# Patient Record
Sex: Male | Born: 1955 | Race: White | Hispanic: No | Marital: Married | Smoking: Never smoker
Health system: Southern US, Community
[De-identification: ages and names within clinical notes are randomized; demographics above are authoritative.]

## PROBLEM LIST (undated history)

## (undated) DIAGNOSIS — E119 Type 2 diabetes mellitus without complications: Secondary | ICD-10-CM

## (undated) DIAGNOSIS — E785 Hyperlipidemia, unspecified: Secondary | ICD-10-CM

## (undated) HISTORY — PX: CHOLECYSTECTOMY: SHX55

## (undated) HISTORY — PX: BACK SURGERY: SHX140

---

## 2000-02-04 ENCOUNTER — Encounter: Payer: Self-pay | Admitting: Neurosurgery

## 2000-02-04 ENCOUNTER — Ambulatory Visit (HOSPITAL_COMMUNITY): Admission: RE | Admit: 2000-02-04 | Discharge: 2000-02-04 | Payer: Self-pay | Admitting: Neurosurgery

## 2000-02-25 ENCOUNTER — Ambulatory Visit (HOSPITAL_COMMUNITY): Admission: RE | Admit: 2000-02-25 | Discharge: 2000-02-25 | Payer: Self-pay | Admitting: Neurosurgery

## 2000-02-25 ENCOUNTER — Encounter: Payer: Self-pay | Admitting: Neurosurgery

## 2000-03-16 ENCOUNTER — Encounter: Payer: Self-pay | Admitting: Neurosurgery

## 2000-03-16 ENCOUNTER — Ambulatory Visit (HOSPITAL_COMMUNITY): Admission: RE | Admit: 2000-03-16 | Discharge: 2000-03-16 | Payer: Self-pay | Admitting: Neurosurgery

## 2000-03-30 ENCOUNTER — Ambulatory Visit (HOSPITAL_COMMUNITY): Admission: RE | Admit: 2000-03-30 | Discharge: 2000-03-30 | Payer: Self-pay | Admitting: Neurosurgery

## 2000-03-30 ENCOUNTER — Encounter: Payer: Self-pay | Admitting: Neurosurgery

## 2000-04-13 ENCOUNTER — Encounter: Payer: Self-pay | Admitting: Neurosurgery

## 2000-04-13 ENCOUNTER — Ambulatory Visit (HOSPITAL_COMMUNITY): Admission: RE | Admit: 2000-04-13 | Discharge: 2000-04-13 | Payer: Self-pay | Admitting: Neurosurgery

## 2000-06-18 ENCOUNTER — Encounter: Payer: Self-pay | Admitting: Neurosurgery

## 2000-06-18 ENCOUNTER — Ambulatory Visit (HOSPITAL_COMMUNITY): Admission: RE | Admit: 2000-06-18 | Discharge: 2000-06-18 | Payer: Self-pay | Admitting: Neurosurgery

## 2000-07-11 ENCOUNTER — Encounter: Payer: Self-pay | Admitting: Neurosurgery

## 2000-07-13 ENCOUNTER — Inpatient Hospital Stay (HOSPITAL_COMMUNITY): Admission: RE | Admit: 2000-07-13 | Discharge: 2000-07-15 | Payer: Self-pay | Admitting: Neurosurgery

## 2000-07-13 ENCOUNTER — Encounter: Payer: Self-pay | Admitting: Neurosurgery

## 2000-08-21 ENCOUNTER — Encounter: Payer: Self-pay | Admitting: Neurosurgery

## 2000-08-21 ENCOUNTER — Encounter: Admission: RE | Admit: 2000-08-21 | Discharge: 2000-08-21 | Payer: Self-pay | Admitting: Neurosurgery

## 2000-10-01 ENCOUNTER — Encounter (HOSPITAL_COMMUNITY): Admission: RE | Admit: 2000-10-01 | Discharge: 2000-11-03 | Payer: Self-pay | Admitting: Neurosurgery

## 2000-12-14 ENCOUNTER — Encounter: Admission: RE | Admit: 2000-12-14 | Discharge: 2000-12-14 | Payer: Self-pay | Admitting: Neurosurgery

## 2000-12-14 ENCOUNTER — Encounter: Payer: Self-pay | Admitting: Neurosurgery

## 2001-01-29 ENCOUNTER — Encounter: Admission: RE | Admit: 2001-01-29 | Discharge: 2001-01-29 | Payer: Self-pay | Admitting: Neurosurgery

## 2001-01-29 ENCOUNTER — Encounter: Payer: Self-pay | Admitting: Neurosurgery

## 2001-03-26 ENCOUNTER — Inpatient Hospital Stay (HOSPITAL_COMMUNITY): Admission: RE | Admit: 2001-03-26 | Discharge: 2001-03-29 | Payer: Self-pay | Admitting: Neurosurgery

## 2001-03-26 ENCOUNTER — Encounter: Payer: Self-pay | Admitting: Neurosurgery

## 2004-02-10 ENCOUNTER — Ambulatory Visit (HOSPITAL_COMMUNITY): Admission: RE | Admit: 2004-02-10 | Discharge: 2004-02-10 | Payer: Self-pay | Admitting: *Deleted

## 2004-02-17 ENCOUNTER — Ambulatory Visit (HOSPITAL_COMMUNITY): Admission: RE | Admit: 2004-02-17 | Discharge: 2004-02-17 | Payer: Self-pay | Admitting: *Deleted

## 2005-09-11 ENCOUNTER — Ambulatory Visit (HOSPITAL_COMMUNITY): Admission: RE | Admit: 2005-09-11 | Discharge: 2005-09-11 | Payer: Self-pay | Admitting: Neurosurgery

## 2007-12-26 ENCOUNTER — Ambulatory Visit (HOSPITAL_COMMUNITY): Admission: RE | Admit: 2007-12-26 | Discharge: 2007-12-26 | Payer: Self-pay | Admitting: Family Medicine

## 2010-10-14 NOTE — Op Note (Signed)
[place]. [facility]  Patient:    Samuel Fowler, Samuel Fowler                        MRN: [id number] Proc. Date: 07/13/00 Adm. 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  POSTOPERATIVE DIAGNOSES: 1. 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 2. 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  SURGEON:  Payton Doughty, M.D.  ANESTHESIA:  General endotracheal.  PREPARATION:  Sterile Betadine scrub and prep with alcohol wipe.  COMPLICATIONS:  None.  DESCRIPTION OF PROCEDURE:  A 55 year old right-handed white gentleman with severe spondylosis at L4-5 and L5-S1.  He was taken to the operating room and smoothly anesthetized and intubated, placed prone on the operating table. Following shave, prep, and drape in the usual sterile fashion, skin was infiltrated with 1% lidocaine and 1:400,000 epinephrine.  Skin was incised from top of L4 to the mid-S1, and the laminae of L4, L5, and the top of S1 were exposed bilaterally in the subperiosteal plane out over the facet joints. Intraoperative x-ray confirmed correctness of the level.  The pars interarticularis, lamina, and inferior facet of L4 and L5 and the superior facets of L5 and S1 were removed bilaterally using the high-speed drill.  The ligamentum flavum was removed, and the 4, 5, and 1 roots were dissected free as they rounded their respective pedicles.  At 5-1 on the left side, there was spondylolysis at 5 with a large herniated disk compression at base of tongue the 5 and the 1 roots as they traversed this area.  Right side was slightly less affected but still significant change.  4-5 simply had severe spondylitic disease.  Following complete diskectomy at both levels and complete dissection of the nerve roots, Ray Threaded Fusion Cages 14 x 21 mm were placed bilaterally.  Intraoperative x-ray showed good placement of the cages.  The wound was irrigated  and hemostasis assured.  The fascia was reapproximated with 0 Vicryl in interrupted fashion, subcutaneous tissue was reapproximated with 0 Vicryl in interrupted fashion, subcuticular tissue was reapproximated with 3-0 Vicryl in interrupted fashion.  Skin was closed with 3-0 nylon in running locked fashion.  A Betadine and Telfa dressing was applied and made occlusive with OpSite.  The patient then returned to the recovery room in good condition. DD:  07/13/00 TD:  07/13/00 Job: [id number]

## 2010-10-14 NOTE — H&P (Signed)
[place]. [facility]  Patient:    Samuel Fowler, Samuel Fowler Visit Number: [id number] MRN: [id number]          Service Type: SUR Location: 3000 3023 01 Attending Physician:  Emeterio Reeve Dictated by:   Payton Doughty, M.D. 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  HISTORY OF PRESENT ILLNESS:  This is a 55 year old right-handed white gentleman who in February underwent a 4-5, 5-1 fusion.  He has evidence for pseudo arthrosis and is admitted for an augmentation.  PAST SURGICAL HISTORY:  Remarkable for cholecystectomy in 1991.  No other operations besides his back.  ALLERGIES:  PENICILLIN.  PAST MEDICAL HISTORY:  None.  SOCIAL HISTORY:  He smokes a pack and a half of cigarettes a day.  Drinks alcohol on a social basis.  FAMILY HISTORY:  Mom is 39, in poor health with hypertension.  Dad is deceased at 47 years of age from cancer.  REVIEW OF SYSTEMS:  Remarkable for leg pain when he is walking, night sweats, arm weakness, leg pain, back pain, arm pain, joint pain, neck pain.  PHYSICAL EXAMINATION  HEENT:  Within normal limits.  NECK:  Limited range of motion.  CHEST:  Clear.  CARDIAC:  Regular rate and rhythm.  ABDOMEN:  Nontender.  No hepatosplenomegaly.  Somewhat large.  EXTREMITIES:  Without clubbing or cyanosis.  Peripheral pulses are good.  GENITOURINARY:  Deferred.  NEUROLOGIC:  He is awake, alert, oriented.  Cranial nerves are intact.  Motor examination shows 5/5 strength throughout the upper and lower extremities.  No sensory deficit.  He has a mild left pronator drift.  Reflexes are 2 throughout the right upper extremity, 3 in the left upper extremity. Hoffmans is positive on the left with clonus of the fingers.  The lower extremities have three beats of clonus on the left side, none on the right. Clonus intermittent.  LABORATORIES:  Discography has  demonstrated penetration dye in the cages.  CLINICAL IMPRESSION:  Pseudo arthrosis.  He is admitted now for pedicle screw augmentation.  The risks and benefits of this approach have been discussed with him and he wishes to proceed. Dictated by:   Payton Doughty, M.D. Attending Physician:  Emeterio Reeve DD:  03/26/01 TD:  03/26/01 Job: [id number]

## 2010-10-14 NOTE — Op Note (Signed)
[place]. [facility]  Patient:    Samuel Fowler, Samuel Fowler Visit Number: [id number] MRN: [id number]          Service Type: SUR Location: 3000 3023 01 Attending Physician:  Emeterio Reeve Dictated by:   Payton Doughty, M.D. Proc. Date: 03/26/01 Admit Date:  03/26/2001                             Operative Report  PREOPERATIVE DIAGNOSIS:  Pseudoarthrosis L4-5, L5-S1.  POSTOPERATIVE DIAGNOSIS:  Pseudoarthrosis L4-5, L5-S1.  OPERATION PERFORMED:  L4-5, L5-S1 pedicle screw augmentation and fusion with lateral arthrodesis.  SURGEON:  Payton Doughty, M.D.  ASSISTANT:  Cristi Loron, M.D.  ANESTHESIA:  General endotracheal.  PREP:  Sterile Betadine prep and scrub with alcohol wipe.  COMPLICATIONS:  None.  DESCRIPTION OF PROCEDURE:  The patient is a 55 year old right-handed white male with pseudoarthrosis at 4-5 and 5-1.  The patient was taken to the operating room, smoothly anesthetized and intubated, and placed prone on the operating table.  Following shave, prep and drape in the usual sterile fashion the skin was infiltrated with 1% lidocaine, 1:400,000 epinephrine.  The old skin incision was reopened and extended by 2 cm on each end.  The lamina of L3 was identified and this was followed out to the 3-4 facet joint and the transverse process of 4.  Working lateral to this, the transverse process of 5 and the sacral ala were then identified and followed medially to their respective pedicles of L5 and S1.  This was carried out bilaterally exposing the transverse process, sacral ala and top of the pedicles of L4, L5 and S1. Using these landmarks, pedicle screws were placed without difficulty and intraoperative x-ray showed good placement of screws.  Prior to placing the screw in the sacrum, bone marrow was aspirated and placed in the ____________ matrix.  This was then used for the intertransverse arthrodesis.  Following placement of the arthrodesis materials,  rods were placed in the screws and capped.  The intraoperative x-ray showed good placement of pedicle screws and rods.  The fascia was reapproximated with 0 Vicryl in interrupted fashion. Subcutaneous tissues were reapproximated with 0 Vicryl in interrupted fashion. subcuticular tissues reapproximated with 3-0 Vicryl in interrupted fashion. The skin was closed with 3-0 nylon in running locked fashion.  Betadine and Telfa dressing was applied and made occlusive with Op-Site.  The patient then returned to the recovery room in good condition. Dictated by:   Payton Doughty, M.D. Attending Physician:  Emeterio Reeve DD:  03/26/01 TD:  03/27/01 Job: [id number]

## 2010-10-14 NOTE — H&P (Signed)
[place]. [facility]  Patient:    Samuel Fowler, Samuel Fowler                        MRN: [id number] Adm. Date:  [id number] Disc. 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  SERVICE:  Neurosurgery.  HISTORY OF PRESENT ILLNESS:  A very nice 55 year old left-handed white gentleman who I saw initially in August for neck and shoulder pain.  His MRI at that time was unremarkable in his neck, but he had some clonus in his lower extremities and was studied with MR of his brain which was normal.  His major complaint has always been back pain and pain in his legs.  He was studied with MRI of his lumbar spine which demonstrates spondylitic disease and spondylolysis of L5 without L5-S1 slip.  He tried epidural steroids to no avail.  Discography was strongly and concordantly positive of both 4-5 and 5-1 and he is now admitted for a fusion.  SURGICAL HISTORY:  Remarkable for a cholecystectomy in 1991.  He has had no other operations.  ALLERGIES:  PENICILLIN.  MEDICAL HISTORY:  He has no other medical problems.  SOCIAL HISTORY:  He smokes a pack-and-a-half of cigarettes a day and drinks alcohol on a social basis.  FAMILY HISTORY:  Mom is 38 and in poor health with hypertension, arteriosclerosis.  Dad is deceased at 85 years of age of lung cancer.  REVIEW OF SYSTEMS:  Remarkable for leg pain while he is walking, night sweats, arm weakness, leg weakness, back pain, arm pain, leg pain, joint pain, and neck pain.  PHYSICAL EXAMINATION:  HEENT:  Within normal limits.  NECK:  He has limited range of motion in his neck.  CHEST:  Diffuse crackles.  CARDIAC:  Regular rate and rhythm.  ABDOMEN:  Large but nontender.  There is no hepatosplenomegaly.  EXTREMITIES:  Without clubbing or cyanosis.  GENITOURINARY:  Deferred.  PERIPHERAL PULSES:   Good.  NEUROLOGIC:  He is awake, alert, and oriented.  His cranial nerves are intact. Motor exam is 5/5 strength throughout the upper and lower extremities.  There is no sensory deficit.  He does have a mild left pronator drift.  Reflexes are 2 throughout in the right upper extremity, 3 in the left upper extremity. Hoffmanns is positive on the left with clonus of the fingers.  In the lower extremities, he has three beats of clonus on the left side, none on the right. The clonus is intermittent.  LABORATORY DATA:  MRI and discography results have been reviewed above.  CLINICAL IMPRESSION:  Spondylolysis of L5, spondylosis at 4-5 and 5-1.  PLAN:  The plan is for a lumbar laminectomy and diskectomy with posterior lumbar interbody fusion.   The risks and benefits of this approach have been discussed with him and he wishes to proceed. DD:  07/13/00 TD:  07/13/00 Job: [id number]

## 2010-10-14 NOTE — Procedures (Signed)
NAME:  ANDI, MAHAFFY               ACCOUNT NO.:  [id number]   MEDICAL RECORD NO.:  [id number]          PATIENT TYPE:  OUT   LOCATION:  RAD                           FACILITY:  [facility]   PHYSICIAN:  Vida Roller, M.D.   DATE OF BIRTH:  08/02/1955   DATE OF PROCEDURE:  02/17/2004  DATE OF DISCHARGE:                                    STRESS TEST   HISTORY:  Mr. Salceda is a 55 year old gentleman with no known coronary  disease with atypical chest discomfort whose cardiac risk factors include  tobacco abuse, family history, and unknown lipid status.   BASELINE DATA:  EKG reveals sinus rhythm at 78 beats per minute, nonspecific  ST abnormalities, blood pressure is 120/62.   Adenosine 65 mg was infused over a 4 minute protocol with Cardiolite  injected at 3 minutes.  The patient reported chest tightness, shortness of  breath, flushing, weakness, and nausea which resolved in recovery.  He  reports that this feels just like the episodes he had described to Dr.  Dorethea Clan on February 10, 2004.  EKG revealed no arrhythmias and no ischemic  changes.   Final images and results are pending M.D. review.      AB/MEDQ  D:  02/17/2004  T:  02/18/2004  Job:  [id number]

## 2010-10-14 NOTE — Discharge Summary (Signed)
[place]. [facility]  Patient:    Samuel Fowler, Samuel Fowler Visit Number: [id number] MRN: [id number]          Service Type: SUR Location: 3000 3023 01 Attending Physician:  Emeterio Reeve Dictated by:   Payton Doughty, M.D. 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  PROCEDURE:  L4-5, L5-S1 augmentation with pedicle screws.  COMPLICATIONS:  None.  DISCHARGE STATUS:  Alive and well.  HISTORY OF PRESENT ILLNESS:  This is a 54 year old right-handed white gentleman whose history and physical is recounted in the chart.  He had a Ray cage fusion in February, had done well for a while, had increasing back pain after physical therapy.  Discography demonstrated nonunion and he is admitted for pedicle screw augmentation.  MEDICAL HISTORY:  Relatively benign.  SOCIAL HISTORY:  He smokes a pack and a half of cigarettes a day.  ALLERGIES:  He is allergic to PENICILLIN.  MEDICATIONS:  He is not on any other medications.  PHYSICAL EXAMINATION:  General exam within normal limits.  Neurologic exam was intact with low back pain with any kind of movement and pain down into his right hip.  HOSPITAL COURSE:  He was admitted after ascertainment of normal laboratory values and underwent a 4-5, 5-1 pedicle screw augmentation.  Postoperatively, he has done very well.  His leg pain is completely gone.  He has incisional back pain but has been up and about and walking in physical therapy.  Foley was stopped the first day and PCA stopped the second day.  He is now up and about, alert and oriented, satisfied with his activities of daily living.  DISCHARGE MEDICATIONS:  He is being discharged home on Percocet for pain and ciprofloxacin for skin prophylaxis for a small amount of drainage he had from his back for a day or  so.  FOLLOWUP:  His followup will be in [facility] office next Friday for suture removal. Dictated by:   Payton Doughty, M.D. Attending Physician:  Emeterio Reeve DD:  03/29/01 TD:  03/30/01 Job: [id number]

## 2017-08-13 DIAGNOSIS — M47816 Spondylosis without myelopathy or radiculopathy, lumbar region: Secondary | ICD-10-CM | POA: Diagnosis not present

## 2017-11-20 DIAGNOSIS — M47816 Spondylosis without myelopathy or radiculopathy, lumbar region: Secondary | ICD-10-CM | POA: Diagnosis not present

## 2018-02-19 DIAGNOSIS — M47816 Spondylosis without myelopathy or radiculopathy, lumbar region: Secondary | ICD-10-CM | POA: Diagnosis not present

## 2018-06-19 DIAGNOSIS — M47816 Spondylosis without myelopathy or radiculopathy, lumbar region: Secondary | ICD-10-CM | POA: Diagnosis not present

## 2018-08-21 DIAGNOSIS — F419 Anxiety disorder, unspecified: Secondary | ICD-10-CM | POA: Diagnosis not present

## 2018-08-21 DIAGNOSIS — Z6828 Body mass index (BMI) 28.0-28.9, adult: Secondary | ICD-10-CM | POA: Diagnosis not present

## 2018-09-24 DIAGNOSIS — D044 Carcinoma in situ of skin of scalp and neck: Secondary | ICD-10-CM | POA: Diagnosis not present

## 2018-09-24 DIAGNOSIS — C4442 Squamous cell carcinoma of skin of scalp and neck: Secondary | ICD-10-CM | POA: Diagnosis not present

## 2018-10-11 DIAGNOSIS — Z6827 Body mass index (BMI) 27.0-27.9, adult: Secondary | ICD-10-CM | POA: Diagnosis not present

## 2018-10-11 DIAGNOSIS — C4442 Squamous cell carcinoma of skin of scalp and neck: Secondary | ICD-10-CM | POA: Diagnosis not present

## 2018-10-29 DIAGNOSIS — Z6826 Body mass index (BMI) 26.0-26.9, adult: Secondary | ICD-10-CM | POA: Diagnosis not present

## 2018-10-29 DIAGNOSIS — M5430 Sciatica, unspecified side: Secondary | ICD-10-CM | POA: Diagnosis not present

## 2019-01-18 ENCOUNTER — Emergency Department (HOSPITAL_COMMUNITY): Payer: Medicare PPO

## 2019-01-18 ENCOUNTER — Other Ambulatory Visit: Payer: Self-pay

## 2019-01-18 ENCOUNTER — Emergency Department (HOSPITAL_COMMUNITY)
Admission: EM | Admit: 2019-01-18 | Discharge: 2019-01-19 | Discharge status: Against Medical Advice | Payer: Medicare PPO | Attending: Emergency Medicine | Admitting: Emergency Medicine

## 2019-01-18 ENCOUNTER — Encounter (HOSPITAL_COMMUNITY): Payer: Self-pay | Admitting: Emergency Medicine

## 2019-01-18 DIAGNOSIS — M79645 Pain in left finger(s): Secondary | ICD-10-CM | POA: Diagnosis present

## 2019-01-18 DIAGNOSIS — M65842 Other synovitis and tenosynovitis, left hand: Secondary | ICD-10-CM | POA: Diagnosis not present

## 2019-01-18 DIAGNOSIS — Z23 Encounter for immunization: Secondary | ICD-10-CM | POA: Insufficient documentation

## 2019-01-18 DIAGNOSIS — L089 Local infection of the skin and subcutaneous tissue, unspecified: Secondary | ICD-10-CM | POA: Diagnosis not present

## 2019-01-18 DIAGNOSIS — R600 Localized edema: Secondary | ICD-10-CM | POA: Diagnosis not present

## 2019-01-18 DIAGNOSIS — M65142 Other infective (teno)synovitis, left hand: Secondary | ICD-10-CM | POA: Diagnosis not present

## 2019-01-18 DIAGNOSIS — M659 Synovitis and tenosynovitis, unspecified: Secondary | ICD-10-CM

## 2019-01-18 NOTE — ED Triage Notes (Signed)
Hand injury on Weds  Hit blade or deck of lawnmower  Swelling and pain since last night   Redness and swelling to R hand especially the R little finger

## 2019-01-19 LAB — COMPREHENSIVE METABOLIC PANEL
ALT: 25 U/L (ref 0–44)
AST: 18 U/L (ref 15–41)
Albumin: 4.2 g/dL (ref 3.5–5.0)
Alkaline Phosphatase: 68 U/L (ref 38–126)
Anion gap: 8 (ref 5–15)
BUN: 13 mg/dL (ref 8–23)
CO2: 26 mmol/L (ref 22–32)
Calcium: 9.1 mg/dL (ref 8.9–10.3)
Chloride: 102 mmol/L (ref 98–111)
Creatinine, Ser: 0.72 mg/dL (ref 0.61–1.24)
GFR calc Af Amer: >60 mL/min (ref >60)
GFR calc non Af Amer: >60 mL/min (ref >60)
Glucose, Bld: 109 mg/dL — ABNORMAL HIGH (ref 70–99)
Potassium: 3.6 mmol/L (ref 3.5–5.1)
Sodium: 136 mmol/L (ref 135–145)
Total Bilirubin: 1 mg/dL (ref 0.3–1.2)
Total Protein: 7.7 g/dL (ref 6.5–8.1)

## 2019-01-19 LAB — CBC WITH DIFFERENTIAL/PLATELET
Abs Immature Granulocytes: 0.04 10*3/uL (ref 0.00–0.07)
Basophils Absolute: 0 10*3/uL (ref 0.0–0.1)
Basophils Relative: 0 %
Eosinophils Absolute: 0.2 10*3/uL (ref 0.0–0.5)
Eosinophils Relative: 1 %
HCT: 40.1 % (ref 39.0–52.0)
Hemoglobin: 13.2 g/dL (ref 13.0–17.0)
Immature Granulocytes: 0 %
Lymphocytes Relative: 19 %
Lymphs Abs: 2.6 10*3/uL (ref 0.7–4.0)
MCH: 30.3 pg (ref 26.0–34.0)
MCHC: 32.9 g/dL (ref 30.0–36.0)
MCV: 92 fL (ref 80.0–100.0)
Monocytes Absolute: 1 10*3/uL (ref 0.1–1.0)
Monocytes Relative: 7 %
Neutro Abs: 10.1 10*3/uL — ABNORMAL HIGH (ref 1.7–7.7)
Neutrophils Relative %: 73 %
Platelets: 221 10*3/uL (ref 150–400)
RBC: 4.36 MIL/uL (ref 4.22–5.81)
RDW: 13.5 % (ref 11.5–15.5)
WBC: 13.9 10*3/uL — ABNORMAL HIGH (ref 4.0–10.5)
nRBC: 0 % (ref 0.0–0.2)

## 2019-01-19 LAB — LACTIC ACID, PLASMA: Lactic Acid, Venous: 0.7 mmol/L (ref 0.5–1.9)

## 2019-01-19 MED ORDER — VANCOMYCIN HCL IN DEXTROSE 1-5 GM/200ML-% IV SOLN: 1000.0000 mg | Freq: Three times a day (TID) | INTRAVENOUS | Status: DC

## 2019-01-19 MED ORDER — SODIUM CHLORIDE 0.9 % IV SOLN
2.0000 g | Freq: Once | INTRAVENOUS | Status: AC
  Administered 2019-01-19: 2 g via INTRAVENOUS
  Filled 2019-01-19: qty 2

## 2019-01-19 MED ORDER — TETANUS-DIPHTH-ACELL PERTUSSIS 5-2.5-18.5 LF-MCG/0.5 IM SUSP
0.5000 mL | Freq: Once | INTRAMUSCULAR | Status: AC
  Administered 2019-01-19: 0.5 mL via INTRAMUSCULAR
  Filled 2019-01-19: qty 0.5

## 2019-01-19 MED ORDER — DOXYCYCLINE HYCLATE 100 MG PO CAPS: 100.0000 mg | ORAL_CAPSULE | Freq: Two times a day (BID) | ORAL | 0 refills | Status: DC

## 2019-01-19 MED ORDER — VANCOMYCIN HCL IN DEXTROSE 1-5 GM/200ML-% IV SOLN
1000.0000 mg | INTRAVENOUS | Status: AC
  Administered 2019-01-19 (×2): 1000 mg via INTRAVENOUS
  Filled 2019-01-19 (×2): qty 200

## 2019-01-19 NOTE — Discharge Instructions (Addendum)
You are leaving [place].  It was recommended that you be transferred to [facility] for evaluation by the hand surgeon for probable surgery.  It is unlikely this infection will get better with just antibiotics.  As we discussed, the infection can get worse which can progress to the loss of your finger or a serious infection that can be life-threatening. Return to the ED if you wish to be reevaluated.

## 2019-01-19 NOTE — ED Notes (Signed)
Pt states he hit his hand on the lawnmower this past Wednesday on the outside of his left little finger, but the swelling worsened and the skin broke open on the opposite side of same finger and has drainage. Hand is remarkably swollen, red, warm to touch and ender. Pt cannot remember last Tetanus shot.

## 2019-01-19 NOTE — Progress Notes (Signed)
Pharmacy Antibiotic Note  Samuel Fowler is a 63 y.o. male admitted on 01/18/2019 with cellulitis.  Pharmacy has been consulted for vancomycin dosing.  Plan: Vancomycin 2000 mg IV loading dose, followed by vancomycin 1000 mg IV every 8 hours. Monitor clinical progress, cultures/sensitivities, renal function, abx plan, Vancomycin levels as indicated.   Height: 5\' 8"  (172.7 cm) Weight: 182 lb (82.6 kg) IBW/kg (Calculated) : 68.4  Temp (24hrs), Avg:97.8 F (36.6 C), Min:97.8 F (36.6 C), Max:97.8 F (36.6 C)  Recent Labs  Lab 01/19/19 0115  WBC 13.9*  CREATININE 0.72  LATICACIDVEN 0.7    Estimated Creatinine Clearance: 100.3 mL/min (by C-G formula based on SCr of 0.72 mg/dL).    Allergies  Allergen Reactions  . Aspirin   . Penicillins     Antimicrobials this admission: 8/23 cefepime >>  8/23 vancomycin >>   Dose adjustments this admission:  Microbiology results: 8/23 BCx: pending  Thank you for allowing pharmacy to be a part of this patient's care.  Jens Som, PharmD 01/19/2019 2:16 AM

## 2019-01-19 NOTE — ED Provider Notes (Signed)
[facility] EMERGENCY DEPARTMENT Provider Note   CSN: [id number] Arrival date & time: 01/18/19  2047     History   Chief Complaint Chief Complaint  Patient presents with  . Hand Problem    HPI Samuel Fowler is a 63 y.o. male.     Patient states he struck his left small finger against the blade of a lawnmower 3 days ago as he was trying to adjust it.  The lawnmower was not running.  He sustained a small laceration to the distal phalanx of his left fifth digit.  Over the past 1 day he had increased pain, redness, swelling and drainage from his fifth finger.  There is a wound that has opened up on the radial side of the finger even though injury occurred on the ulnar side.  He states this finger has become increasingly warm, swollen, painful, unable to move it.  Denies fevers.  Denies any nausea or vomiting.  Has some numbness and tingling in the finger.  Does not know when his last tetanus shot was.  Allergic to penicillin.  The history is provided by the patient.    History reviewed. No pertinent past medical history.  There are no active problems to display for this patient.   History reviewed. No pertinent surgical history.      Home Medications    Prior to Admission medications   Not on File    Family History History reviewed. No pertinent family history.  Social History Social History   Tobacco Use  . Smoking status: Never Smoker  . Smokeless tobacco: Never Used  Substance Use Topics  . Alcohol use: Not Currently  . Drug use: Yes    Types: Marijuana     Allergies   Aspirin and Penicillins   Review of Systems Review of Systems  Constitutional: Negative for activity change, appetite change and fever.  HENT: Negative for congestion and rhinorrhea.   Eyes: Negative for visual disturbance.  Respiratory: Negative for cough, chest tightness and shortness of breath.   Cardiovascular: Negative for chest pain.  Gastrointestinal: Negative for abdominal  pain, nausea and vomiting.  Genitourinary: Negative for dysuria and hematuria.  Musculoskeletal: Positive for arthralgias and myalgias.  Skin: Positive for wound.  Neurological: Negative for dizziness, weakness and headaches.   all other systems are negative except as noted in the HPI and PMH.     Physical Exam Updated Vital Signs BP (!) 144/88 (BP Location: Right Arm)   Pulse (!) 104   Temp 97.8 F (36.6 C) (Oral)   Resp 16   Ht 5\' 8"  (1.727 m)   Wt 82.6 kg   SpO2 100%   BMI 27.67 kg/m   Physical Exam Vitals signs and nursing note reviewed.  Constitutional:      General: He is not in acute distress.    Appearance: He is well-developed.  HENT:     Head: Normocephalic and atraumatic.     Mouth/Throat:     Pharynx: No oropharyngeal exudate.  Eyes:     Conjunctiva/sclera: Conjunctivae normal.     Pupils: Pupils are equal, round, and reactive to light.  Neck:     Musculoskeletal: Normal range of motion and neck supple.     Comments: No meningismus. Cardiovascular:     Rate and Rhythm: Normal rate and regular rhythm.     Heart sounds: Normal heart sounds. No murmur.  Pulmonary:     Effort: Pulmonary effort is normal. No respiratory distress.     Breath sounds:  Normal breath sounds.  Abdominal:     Palpations: Abdomen is soft.     Tenderness: There is no abdominal tenderness. There is no guarding or rebound.  Musculoskeletal: Normal range of motion.        General: Swelling, tenderness and signs of injury present.     Comments: Left fifth digit is as depicted.  This finger is warm, swollen, red, held in flexed position with minimal movement.  There is diffuse erythema of the proximal phalanx.  There is a small superficial wound to the ulnar distal phalanx.  There is a second break in the skin draining purulent material on the radial side.  Reduced range of motion of PIP and DIP and MCP joints. Tenderness palpation along flexor tendon sheath.  Skin:    General: Skin is  warm.     Capillary Refill: Capillary refill takes less than 2 seconds.  Neurological:     General: No focal deficit present.     Mental Status: He is alert and oriented to person, place, and time. Mental status is at baseline.     Cranial Nerves: No cranial nerve deficit.     Motor: No abnormal muscle tone.     Coordination: Coordination normal.     Comments: No ataxia on finger to nose bilaterally. No pronator drift. 5/5 strength throughout. CN 2-12 intact.Equal grip strength. Sensation intact.   Psychiatric:        Behavior: Behavior normal.            ED Treatments / Results  Labs (all labs ordered are listed, but only abnormal results are displayed) Labs Reviewed  CBC WITH DIFFERENTIAL/PLATELET - Abnormal; Notable for the following components:      Result Value   WBC 13.9 (*)    Neutro Abs 10.1 (*)    All other components within normal limits  COMPREHENSIVE METABOLIC PANEL - Abnormal; Notable for the following components:   Glucose, Bld 109 (*)    All other components within normal limits  CULTURE, BLOOD (ROUTINE X 2)  CULTURE, BLOOD (ROUTINE X 2)  LACTIC ACID, PLASMA    EKG None  Radiology Dg Hand Complete Left  Result Date: 01/18/2019 CLINICAL DATA:  Scratched small finger on lawnmower blade 1 week ago, severe swelling and skin tearing EXAM: LEFT HAND - COMPLETE 3+ VIEW COMPARISON:  None. FINDINGS: No fracture or dislocation of the left hand. Joint spaces are well preserved. There is severe soft tissue edema of the fifth finger. No radiopaque foreign body. IMPRESSION: No fracture or dislocation of the left hand. Joint spaces are well preserved. There is severe soft tissue edema of the fifth finger. No radiopaque foreign body. Electronically Signed   By: Eddie Candle M.D.   On: 01/18/2019 21:31    Procedures Procedures (including critical care time)  Medications Ordered in ED Medications  ceFEPIme (MAXIPIME) 2 g in sodium chloride 0.9 % 100 mL IVPB (has no  administration in time range)  Tdap (BOOSTRIX) injection 0.5 mL (0.5 mLs Intramuscular Given 01/19/19 0034)     Initial Impression / Assessment and Plan / ED Course  I have reviewed the triage vital signs and the nursing notes.  Pertinent labs & imaging results that were available during my care of the patient were reviewed by me and considered in my medical decision making (see chart for details).       Cellulitis of finger with concern for deep space infection and possibly flexor tenosynovitis.  No fever.  X-rays negative.  Will update tetanus and give IV antibiotics. Patient is PCN allergic.   Discussed with hand surgery.  Discussed with hand surgery Dr. Merlyn LotKuzma who reviewed patient's pictures and images on the computer.  He recommends transfer [facility] tonight for assessment of possible debridement. Wounds are dorsal but patient holding finger in flexed position and does not want to extend finger there is some concern for flexor tendon involvement as well.  Patient adamantly refuses to go to [facility].  States he is "fine" and does not like hospitals. He just wants antibiotics and wants to go home.  Discussed with patient that he has a serious infection that he could potentially die from if he gets sepsis.  Discussed he could potentially lose his finger.  Patient states he understands this but refuses to go to [facility] and wishes to have antibiotics and go home.  Discussed with Dr. Merlyn LotKuzma.  Patient does appear to have capacity to refuse care and leave AGAINST MEDICAL ADVICE  Final Clinical Impressions(s) / ED Diagnoses   Final diagnoses:  Finger infection  Tenosynovitis    ED Discharge Orders    None       Quincie Haroon, Jeannett SeniorStephen, MD 01/19/19 [phone]

## 2019-01-24 LAB — CULTURE, BLOOD (ROUTINE X 2)
Culture: NO GROWTH
Culture: NO GROWTH
Special Requests: ADEQUATE

## 2019-01-29 DIAGNOSIS — F419 Anxiety disorder, unspecified: Secondary | ICD-10-CM | POA: Diagnosis not present

## 2019-01-29 DIAGNOSIS — Z6827 Body mass index (BMI) 27.0-27.9, adult: Secondary | ICD-10-CM | POA: Diagnosis not present

## 2019-01-29 DIAGNOSIS — L0390 Cellulitis, unspecified: Secondary | ICD-10-CM | POA: Diagnosis not present

## 2019-02-05 DIAGNOSIS — F419 Anxiety disorder, unspecified: Secondary | ICD-10-CM | POA: Diagnosis not present

## 2019-02-05 DIAGNOSIS — Z6825 Body mass index (BMI) 25.0-25.9, adult: Secondary | ICD-10-CM | POA: Diagnosis not present

## 2019-02-05 DIAGNOSIS — L0390 Cellulitis, unspecified: Secondary | ICD-10-CM | POA: Diagnosis not present

## 2019-03-06 DIAGNOSIS — C4442 Squamous cell carcinoma of skin of scalp and neck: Secondary | ICD-10-CM | POA: Diagnosis not present

## 2019-03-06 DIAGNOSIS — Z6826 Body mass index (BMI) 26.0-26.9, adult: Secondary | ICD-10-CM | POA: Diagnosis not present

## 2019-03-06 DIAGNOSIS — L570 Actinic keratosis: Secondary | ICD-10-CM | POA: Diagnosis not present

## 2019-03-06 DIAGNOSIS — F419 Anxiety disorder, unspecified: Secondary | ICD-10-CM | POA: Diagnosis not present

## 2019-04-10 DIAGNOSIS — Z6826 Body mass index (BMI) 26.0-26.9, adult: Secondary | ICD-10-CM | POA: Diagnosis not present

## 2019-04-10 DIAGNOSIS — M545 Low back pain: Secondary | ICD-10-CM | POA: Diagnosis not present

## 2019-04-10 DIAGNOSIS — C4442 Squamous cell carcinoma of skin of scalp and neck: Secondary | ICD-10-CM | POA: Diagnosis not present

## 2019-04-10 DIAGNOSIS — F419 Anxiety disorder, unspecified: Secondary | ICD-10-CM | POA: Diagnosis not present

## 2019-05-08 DIAGNOSIS — C4442 Squamous cell carcinoma of skin of scalp and neck: Secondary | ICD-10-CM | POA: Diagnosis not present

## 2019-05-08 DIAGNOSIS — F419 Anxiety disorder, unspecified: Secondary | ICD-10-CM | POA: Diagnosis not present

## 2019-05-08 DIAGNOSIS — Z6826 Body mass index (BMI) 26.0-26.9, adult: Secondary | ICD-10-CM | POA: Diagnosis not present

## 2019-06-09 DIAGNOSIS — M545 Low back pain: Secondary | ICD-10-CM | POA: Diagnosis not present

## 2019-06-09 DIAGNOSIS — L570 Actinic keratosis: Secondary | ICD-10-CM | POA: Diagnosis not present

## 2019-06-09 DIAGNOSIS — Z6826 Body mass index (BMI) 26.0-26.9, adult: Secondary | ICD-10-CM | POA: Diagnosis not present

## 2019-06-09 DIAGNOSIS — F419 Anxiety disorder, unspecified: Secondary | ICD-10-CM | POA: Diagnosis not present

## 2019-09-09 DIAGNOSIS — M545 Low back pain: Secondary | ICD-10-CM | POA: Diagnosis not present

## 2019-09-09 DIAGNOSIS — Z6826 Body mass index (BMI) 26.0-26.9, adult: Secondary | ICD-10-CM | POA: Diagnosis not present

## 2019-09-09 DIAGNOSIS — Z0001 Encounter for general adult medical examination with abnormal findings: Secondary | ICD-10-CM | POA: Diagnosis not present

## 2019-09-09 DIAGNOSIS — F419 Anxiety disorder, unspecified: Secondary | ICD-10-CM | POA: Diagnosis not present

## 2019-09-09 DIAGNOSIS — G4700 Insomnia, unspecified: Secondary | ICD-10-CM | POA: Diagnosis not present

## 2019-11-08 DIAGNOSIS — M7551 Bursitis of right shoulder: Secondary | ICD-10-CM | POA: Diagnosis not present

## 2019-11-08 DIAGNOSIS — Z6827 Body mass index (BMI) 27.0-27.9, adult: Secondary | ICD-10-CM | POA: Diagnosis not present

## 2019-12-08 DIAGNOSIS — Z23 Encounter for immunization: Secondary | ICD-10-CM | POA: Diagnosis not present

## 2019-12-08 DIAGNOSIS — Z6827 Body mass index (BMI) 27.0-27.9, adult: Secondary | ICD-10-CM | POA: Diagnosis not present

## 2019-12-08 DIAGNOSIS — S81831A Puncture wound without foreign body, right lower leg, initial encounter: Secondary | ICD-10-CM | POA: Diagnosis not present

## 2019-12-08 DIAGNOSIS — M7551 Bursitis of right shoulder: Secondary | ICD-10-CM | POA: Diagnosis not present

## 2020-03-11 DIAGNOSIS — G4700 Insomnia, unspecified: Secondary | ICD-10-CM | POA: Diagnosis not present

## 2020-03-11 DIAGNOSIS — F419 Anxiety disorder, unspecified: Secondary | ICD-10-CM | POA: Diagnosis not present

## 2020-03-11 DIAGNOSIS — S39012A Strain of muscle, fascia and tendon of lower back, initial encounter: Secondary | ICD-10-CM | POA: Diagnosis not present

## 2020-03-11 DIAGNOSIS — M7551 Bursitis of right shoulder: Secondary | ICD-10-CM | POA: Diagnosis not present

## 2020-05-06 DIAGNOSIS — H2513 Age-related nuclear cataract, bilateral: Secondary | ICD-10-CM | POA: Diagnosis not present

## 2020-05-06 DIAGNOSIS — H35033 Hypertensive retinopathy, bilateral: Secondary | ICD-10-CM | POA: Diagnosis not present

## 2020-05-14 DIAGNOSIS — H2511 Age-related nuclear cataract, right eye: Secondary | ICD-10-CM | POA: Diagnosis not present

## 2020-05-14 DIAGNOSIS — Z01818 Encounter for other preprocedural examination: Secondary | ICD-10-CM | POA: Diagnosis not present

## 2020-06-04 DIAGNOSIS — E782 Mixed hyperlipidemia: Secondary | ICD-10-CM | POA: Diagnosis not present

## 2020-06-04 DIAGNOSIS — Z13220 Encounter for screening for lipoid disorders: Secondary | ICD-10-CM | POA: Diagnosis not present

## 2020-06-04 DIAGNOSIS — Z1329 Encounter for screening for other suspected endocrine disorder: Secondary | ICD-10-CM | POA: Diagnosis not present

## 2020-06-04 DIAGNOSIS — Z1159 Encounter for screening for other viral diseases: Secondary | ICD-10-CM | POA: Diagnosis not present

## 2020-06-04 DIAGNOSIS — R739 Hyperglycemia, unspecified: Secondary | ICD-10-CM | POA: Diagnosis not present

## 2020-06-10 DIAGNOSIS — S39012A Strain of muscle, fascia and tendon of lower back, initial encounter: Secondary | ICD-10-CM | POA: Diagnosis not present

## 2020-06-10 DIAGNOSIS — Z6831 Body mass index (BMI) 31.0-31.9, adult: Secondary | ICD-10-CM | POA: Diagnosis not present

## 2020-06-10 DIAGNOSIS — G4700 Insomnia, unspecified: Secondary | ICD-10-CM | POA: Diagnosis not present

## 2020-06-10 DIAGNOSIS — M7551 Bursitis of right shoulder: Secondary | ICD-10-CM | POA: Diagnosis not present

## 2020-06-10 DIAGNOSIS — F419 Anxiety disorder, unspecified: Secondary | ICD-10-CM | POA: Diagnosis not present

## 2021-04-10 IMAGING — DX LEFT HAND - COMPLETE 3+ VIEW
3 series · 3 of 3 positions shown · non-contrast
Comparison: None.

CLINICAL DATA: Scratched small finger on lawnmower blade 1 week
ago, severe swelling and skin tearing

EXAM:
LEFT HAND - COMPLETE 3+ VIEW

[hand pa]
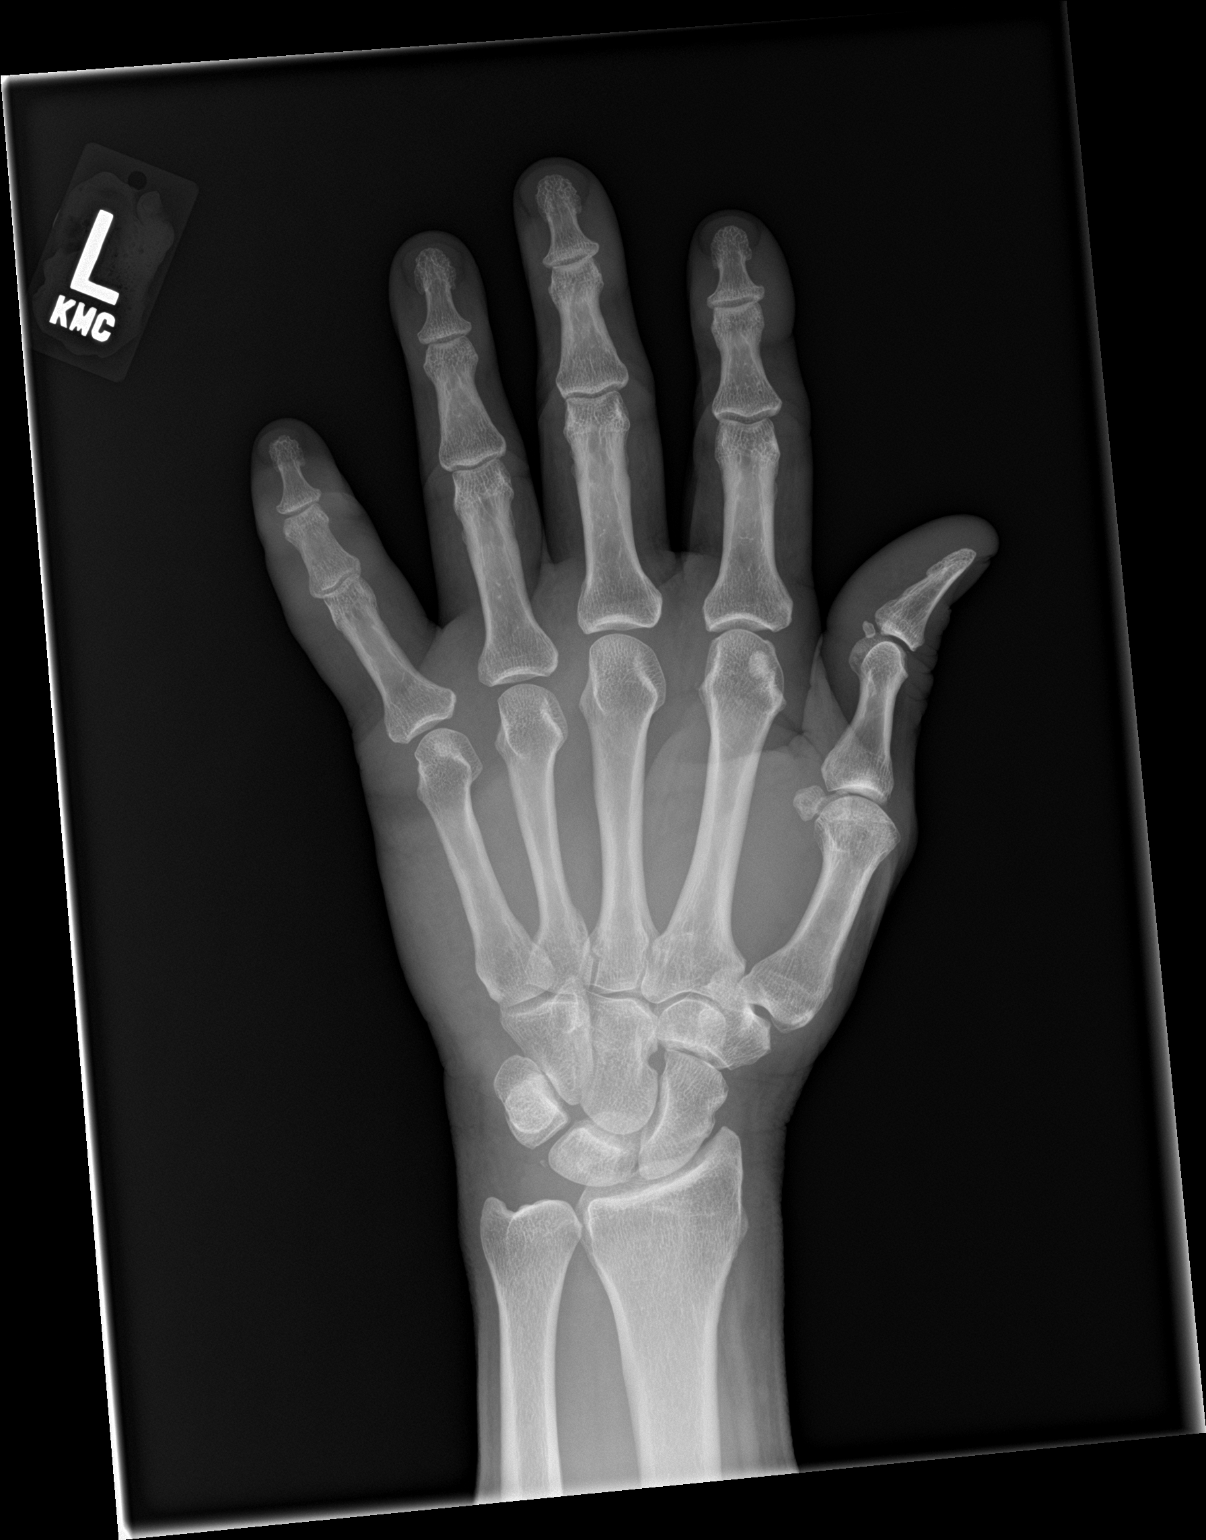

[hand obl]
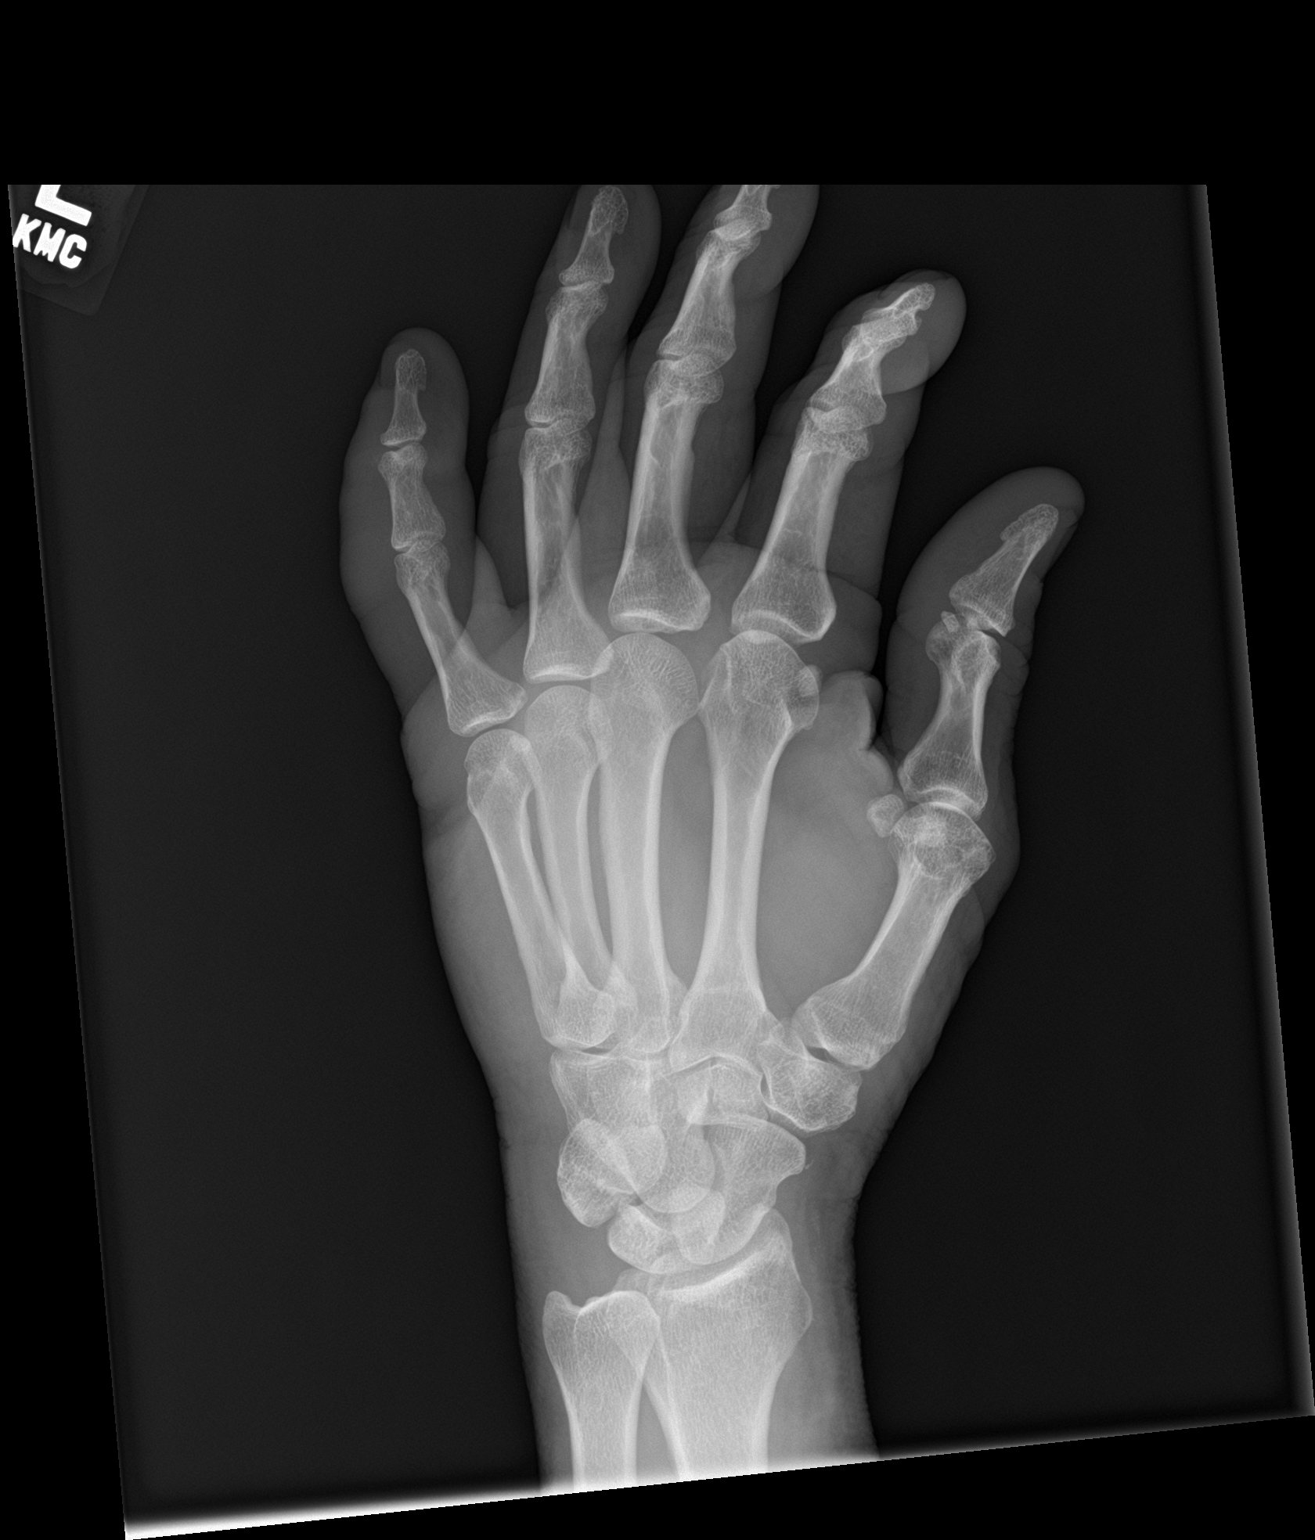

[hand lat]
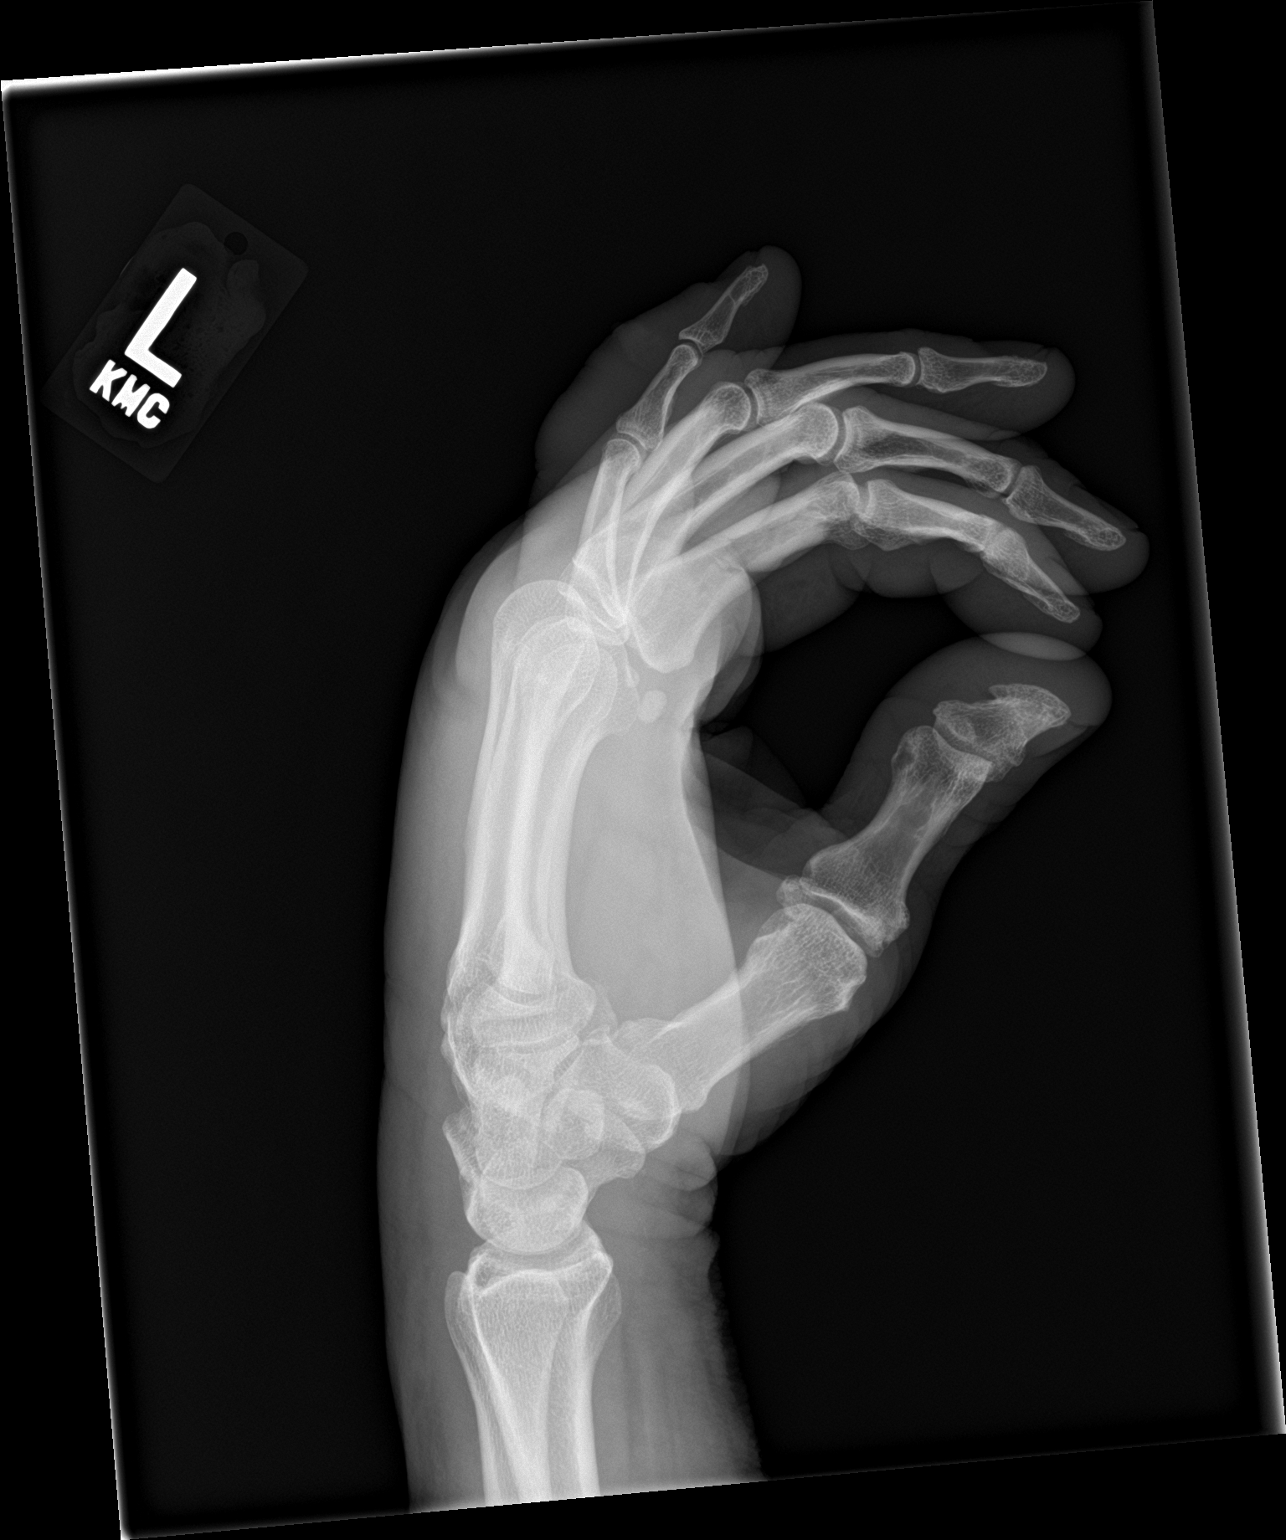

[3 of 3 positions shown; findings below may reference images not displayed]

FINDINGS: No fracture or dislocation of the left hand. Joint spaces are well
preserved. There is severe soft tissue edema of the fifth finger. No
radiopaque foreign body.
IMPRESSION: No fracture or dislocation of the left hand. Joint spaces are well
preserved. There is severe soft tissue edema of the fifth finger. No
radiopaque foreign body.

## 2022-06-09 DIAGNOSIS — L570 Actinic keratosis: Secondary | ICD-10-CM | POA: Diagnosis not present

## 2022-06-09 DIAGNOSIS — R030 Elevated blood-pressure reading, without diagnosis of hypertension: Secondary | ICD-10-CM | POA: Diagnosis not present

## 2022-06-09 DIAGNOSIS — M7551 Bursitis of right shoulder: Secondary | ICD-10-CM | POA: Diagnosis not present

## 2022-06-09 DIAGNOSIS — E785 Hyperlipidemia, unspecified: Secondary | ICD-10-CM | POA: Diagnosis not present

## 2022-06-09 DIAGNOSIS — Z9841 Cataract extraction status, right eye: Secondary | ICD-10-CM | POA: Diagnosis not present

## 2022-06-09 DIAGNOSIS — G4700 Insomnia, unspecified: Secondary | ICD-10-CM | POA: Diagnosis not present

## 2022-06-09 DIAGNOSIS — R739 Hyperglycemia, unspecified: Secondary | ICD-10-CM | POA: Diagnosis not present

## 2022-06-09 DIAGNOSIS — M545 Low back pain, unspecified: Secondary | ICD-10-CM | POA: Diagnosis not present

## 2022-09-08 DIAGNOSIS — R739 Hyperglycemia, unspecified: Secondary | ICD-10-CM | POA: Diagnosis not present

## 2022-09-08 DIAGNOSIS — S39012A Strain of muscle, fascia and tendon of lower back, initial encounter: Secondary | ICD-10-CM | POA: Diagnosis not present

## 2022-09-08 DIAGNOSIS — L570 Actinic keratosis: Secondary | ICD-10-CM | POA: Diagnosis not present

## 2022-09-08 DIAGNOSIS — E785 Hyperlipidemia, unspecified: Secondary | ICD-10-CM | POA: Diagnosis not present

## 2022-09-08 DIAGNOSIS — Z9841 Cataract extraction status, right eye: Secondary | ICD-10-CM | POA: Diagnosis not present

## 2022-09-08 DIAGNOSIS — M7551 Bursitis of right shoulder: Secondary | ICD-10-CM | POA: Diagnosis not present

## 2022-09-08 DIAGNOSIS — R030 Elevated blood-pressure reading, without diagnosis of hypertension: Secondary | ICD-10-CM | POA: Diagnosis not present

## 2022-09-08 DIAGNOSIS — G4700 Insomnia, unspecified: Secondary | ICD-10-CM | POA: Diagnosis not present

## 2022-09-29 DIAGNOSIS — E1165 Type 2 diabetes mellitus with hyperglycemia: Secondary | ICD-10-CM | POA: Diagnosis not present

## 2022-12-05 DIAGNOSIS — R030 Elevated blood-pressure reading, without diagnosis of hypertension: Secondary | ICD-10-CM | POA: Diagnosis not present

## 2022-12-05 DIAGNOSIS — Z1389 Encounter for screening for other disorder: Secondary | ICD-10-CM | POA: Diagnosis not present

## 2022-12-05 DIAGNOSIS — E785 Hyperlipidemia, unspecified: Secondary | ICD-10-CM | POA: Diagnosis not present

## 2022-12-05 DIAGNOSIS — R739 Hyperglycemia, unspecified: Secondary | ICD-10-CM | POA: Diagnosis not present

## 2022-12-05 DIAGNOSIS — E1165 Type 2 diabetes mellitus with hyperglycemia: Secondary | ICD-10-CM | POA: Diagnosis not present

## 2022-12-05 DIAGNOSIS — L570 Actinic keratosis: Secondary | ICD-10-CM | POA: Diagnosis not present

## 2022-12-05 DIAGNOSIS — Z9841 Cataract extraction status, right eye: Secondary | ICD-10-CM | POA: Diagnosis not present

## 2022-12-05 DIAGNOSIS — M545 Low back pain, unspecified: Secondary | ICD-10-CM | POA: Diagnosis not present

## 2022-12-05 DIAGNOSIS — G4700 Insomnia, unspecified: Secondary | ICD-10-CM | POA: Diagnosis not present

## 2023-03-12 DIAGNOSIS — M545 Low back pain, unspecified: Secondary | ICD-10-CM | POA: Diagnosis not present

## 2023-03-12 DIAGNOSIS — R739 Hyperglycemia, unspecified: Secondary | ICD-10-CM | POA: Diagnosis not present

## 2023-03-12 DIAGNOSIS — E785 Hyperlipidemia, unspecified: Secondary | ICD-10-CM | POA: Diagnosis not present

## 2023-03-12 DIAGNOSIS — E1165 Type 2 diabetes mellitus with hyperglycemia: Secondary | ICD-10-CM | POA: Diagnosis not present

## 2023-03-12 DIAGNOSIS — G4700 Insomnia, unspecified: Secondary | ICD-10-CM | POA: Diagnosis not present

## 2023-03-12 DIAGNOSIS — R030 Elevated blood-pressure reading, without diagnosis of hypertension: Secondary | ICD-10-CM | POA: Diagnosis not present

## 2023-03-12 DIAGNOSIS — L570 Actinic keratosis: Secondary | ICD-10-CM | POA: Diagnosis not present

## 2023-03-12 DIAGNOSIS — Z9841 Cataract extraction status, right eye: Secondary | ICD-10-CM | POA: Diagnosis not present

## 2023-06-18 DIAGNOSIS — E1165 Type 2 diabetes mellitus with hyperglycemia: Secondary | ICD-10-CM | POA: Diagnosis not present

## 2023-06-18 DIAGNOSIS — E785 Hyperlipidemia, unspecified: Secondary | ICD-10-CM | POA: Diagnosis not present

## 2023-06-18 DIAGNOSIS — M545 Low back pain, unspecified: Secondary | ICD-10-CM | POA: Diagnosis not present

## 2023-06-18 DIAGNOSIS — R739 Hyperglycemia, unspecified: Secondary | ICD-10-CM | POA: Diagnosis not present

## 2023-06-18 DIAGNOSIS — G4700 Insomnia, unspecified: Secondary | ICD-10-CM | POA: Diagnosis not present

## 2023-09-17 DIAGNOSIS — E1165 Type 2 diabetes mellitus with hyperglycemia: Secondary | ICD-10-CM | POA: Diagnosis not present

## 2023-09-17 DIAGNOSIS — G4700 Insomnia, unspecified: Secondary | ICD-10-CM | POA: Diagnosis not present

## 2023-09-17 DIAGNOSIS — M545 Low back pain, unspecified: Secondary | ICD-10-CM | POA: Diagnosis not present

## 2023-09-17 DIAGNOSIS — L570 Actinic keratosis: Secondary | ICD-10-CM | POA: Diagnosis not present

## 2023-10-19 DIAGNOSIS — G4700 Insomnia, unspecified: Secondary | ICD-10-CM | POA: Diagnosis not present

## 2023-10-19 DIAGNOSIS — E1165 Type 2 diabetes mellitus with hyperglycemia: Secondary | ICD-10-CM | POA: Diagnosis not present

## 2023-10-19 DIAGNOSIS — M545 Low back pain, unspecified: Secondary | ICD-10-CM | POA: Diagnosis not present

## 2023-10-19 DIAGNOSIS — L570 Actinic keratosis: Secondary | ICD-10-CM | POA: Diagnosis not present

## 2023-12-18 DIAGNOSIS — G4700 Insomnia, unspecified: Secondary | ICD-10-CM | POA: Diagnosis not present

## 2023-12-18 DIAGNOSIS — R5383 Other fatigue: Secondary | ICD-10-CM | POA: Diagnosis not present

## 2023-12-18 DIAGNOSIS — M545 Low back pain, unspecified: Secondary | ICD-10-CM | POA: Diagnosis not present

## 2023-12-18 DIAGNOSIS — D649 Anemia, unspecified: Secondary | ICD-10-CM | POA: Diagnosis not present

## 2023-12-18 DIAGNOSIS — Z1329 Encounter for screening for other suspected endocrine disorder: Secondary | ICD-10-CM | POA: Diagnosis not present

## 2023-12-18 DIAGNOSIS — E1165 Type 2 diabetes mellitus with hyperglycemia: Secondary | ICD-10-CM | POA: Diagnosis not present

## 2023-12-18 DIAGNOSIS — E785 Hyperlipidemia, unspecified: Secondary | ICD-10-CM | POA: Diagnosis not present

## 2024-03-17 DIAGNOSIS — L570 Actinic keratosis: Secondary | ICD-10-CM | POA: Diagnosis not present

## 2024-03-17 DIAGNOSIS — E785 Hyperlipidemia, unspecified: Secondary | ICD-10-CM | POA: Diagnosis not present

## 2024-03-17 DIAGNOSIS — D649 Anemia, unspecified: Secondary | ICD-10-CM | POA: Diagnosis not present

## 2024-03-17 DIAGNOSIS — R5383 Other fatigue: Secondary | ICD-10-CM | POA: Diagnosis not present

## 2024-03-17 DIAGNOSIS — E1165 Type 2 diabetes mellitus with hyperglycemia: Secondary | ICD-10-CM | POA: Diagnosis not present

## 2024-03-17 DIAGNOSIS — G4700 Insomnia, unspecified: Secondary | ICD-10-CM | POA: Diagnosis not present

## 2024-03-17 DIAGNOSIS — E039 Hypothyroidism, unspecified: Secondary | ICD-10-CM | POA: Diagnosis not present

## 2024-03-17 DIAGNOSIS — E559 Vitamin D deficiency, unspecified: Secondary | ICD-10-CM | POA: Diagnosis not present

## 2024-03-17 DIAGNOSIS — M545 Low back pain, unspecified: Secondary | ICD-10-CM | POA: Diagnosis not present

## 2024-06-04 ENCOUNTER — Observation Stay (HOSPITAL_COMMUNITY)
Admission: EM | Admit: 2024-06-04 | Discharge: 2024-06-05 | Disposition: A | Discharge status: Home Health | Attending: Family Medicine | Admitting: Family Medicine

## 2024-06-04 ENCOUNTER — Emergency Department (HOSPITAL_COMMUNITY)

## 2024-06-04 ENCOUNTER — Other Ambulatory Visit: Payer: Self-pay

## 2024-06-04 ENCOUNTER — Encounter (HOSPITAL_COMMUNITY): Payer: Self-pay

## 2024-06-04 DIAGNOSIS — E119 Type 2 diabetes mellitus without complications: Secondary | ICD-10-CM | POA: Insufficient documentation

## 2024-06-04 DIAGNOSIS — Z794 Long term (current) use of insulin: Secondary | ICD-10-CM | POA: Diagnosis not present

## 2024-06-04 DIAGNOSIS — E139 Other specified diabetes mellitus without complications: Secondary | ICD-10-CM

## 2024-06-04 DIAGNOSIS — E876 Hypokalemia: Secondary | ICD-10-CM | POA: Diagnosis not present

## 2024-06-04 DIAGNOSIS — Z79899 Other long term (current) drug therapy: Secondary | ICD-10-CM | POA: Insufficient documentation

## 2024-06-04 DIAGNOSIS — G4700 Insomnia, unspecified: Secondary | ICD-10-CM | POA: Insufficient documentation

## 2024-06-04 DIAGNOSIS — I639 Cerebral infarction, unspecified: Principal | ICD-10-CM | POA: Diagnosis present

## 2024-06-04 DIAGNOSIS — F1290 Cannabis use, unspecified, uncomplicated: Secondary | ICD-10-CM | POA: Diagnosis not present

## 2024-06-04 DIAGNOSIS — I6389 Other cerebral infarction: Secondary | ICD-10-CM | POA: Diagnosis not present

## 2024-06-04 DIAGNOSIS — G8191 Hemiplegia, unspecified affecting right dominant side: Secondary | ICD-10-CM | POA: Diagnosis present

## 2024-06-04 HISTORY — DX: Type 2 diabetes mellitus without complications: E11.9

## 2024-06-04 HISTORY — DX: Hyperlipidemia, unspecified: E78.5

## 2024-06-04 LAB — COMPREHENSIVE METABOLIC PANEL WITH GFR
ALT: 24 U/L (ref 0–44)
AST: 26 U/L (ref 15–41)
Albumin: 4.9 g/dL (ref 3.5–5.0)
Alkaline Phosphatase: 69 U/L (ref 38–126)
Anion gap: 17 — ABNORMAL HIGH (ref 5–15)
BUN: 11 mg/dL (ref 8–23)
CO2: 21 mmol/L — ABNORMAL LOW (ref 22–32)
Calcium: 9.7 mg/dL (ref 8.9–10.3)
Chloride: 99 mmol/L (ref 98–111)
Creatinine, Ser: 1.09 mg/dL (ref 0.61–1.24)
GFR, Estimated: >60 mL/min
Glucose, Bld: 163 mg/dL — ABNORMAL HIGH (ref 70–99)
Potassium: 3.3 mmol/L — ABNORMAL LOW (ref 3.5–5.1)
Sodium: 137 mmol/L (ref 135–145)
Total Bilirubin: 0.6 mg/dL (ref 0.0–1.2)
Total Protein: 7.6 g/dL (ref 6.5–8.1)

## 2024-06-04 LAB — I-STAT CHEM 8, ED
BUN: 11 mg/dL (ref 8–23)
Calcium, Ion: 1.17 mmol/L (ref 1.15–1.40)
Chloride: 103 mmol/L (ref 98–111)
Creatinine, Ser: 1.2 mg/dL (ref 0.61–1.24)
Glucose, Bld: 152 mg/dL — ABNORMAL HIGH (ref 70–99)
HCT: 39 % (ref 39.0–52.0)
Hemoglobin: 13.3 g/dL (ref 13.0–17.0)
Potassium: 3.4 mmol/L — ABNORMAL LOW (ref 3.5–5.1)
Sodium: 139 mmol/L (ref 135–145)
TCO2: 20 mmol/L — ABNORMAL LOW (ref 22–32)

## 2024-06-04 LAB — CBC WITH DIFFERENTIAL/PLATELET
Abs Immature Granulocytes: 0.02 K/uL (ref 0.00–0.07)
Basophils Absolute: 0 K/uL (ref 0.0–0.1)
Basophils Relative: 0 %
Eosinophils Absolute: 0.2 K/uL (ref 0.0–0.5)
Eosinophils Relative: 2 %
HCT: 40.7 % (ref 39.0–52.0)
Hemoglobin: 13.8 g/dL (ref 13.0–17.0)
Immature Granulocytes: 0 %
Lymphocytes Relative: 13 %
Lymphs Abs: 1.5 K/uL (ref 0.7–4.0)
MCH: 29.5 pg (ref 26.0–34.0)
MCHC: 33.9 g/dL (ref 30.0–36.0)
MCV: 87 fL (ref 80.0–100.0)
Monocytes Absolute: 0.8 K/uL (ref 0.1–1.0)
Monocytes Relative: 7 %
Neutro Abs: 8.8 K/uL — ABNORMAL HIGH (ref 1.7–7.7)
Neutrophils Relative %: 78 %
Platelets: 207 K/uL (ref 150–400)
RBC: 4.68 MIL/uL (ref 4.22–5.81)
RDW: 13.9 % (ref 11.5–15.5)
WBC: 11.3 K/uL — ABNORMAL HIGH (ref 4.0–10.5)
nRBC: 0 % (ref 0.0–0.2)

## 2024-06-04 LAB — URINE DRUG SCREEN
Amphetamines: NEGATIVE
Barbiturates: NEGATIVE
Benzodiazepines: POSITIVE — AB
Cocaine: NEGATIVE
Fentanyl: NEGATIVE
Methadone Scn, Ur: NEGATIVE
Opiates: NEGATIVE
Tetrahydrocannabinol: POSITIVE — AB

## 2024-06-04 LAB — ETHANOL: Alcohol, Ethyl (B): <15 mg/dL

## 2024-06-04 LAB — PROTIME-INR
INR: 1 (ref 0.8–1.2)
Prothrombin Time: 13.3 s (ref 11.4–15.2)

## 2024-06-04 LAB — CBG MONITORING, ED: Glucose-Capillary: 194 mg/dL — ABNORMAL HIGH (ref 70–99)

## 2024-06-04 MED ORDER — INSULIN ASPART 100 UNIT/ML IJ SOLN: 0.0000 [IU] | Freq: Every day | INTRAMUSCULAR | Status: DC

## 2024-06-04 MED ORDER — ALPRAZOLAM 1 MG PO TABS
3.0000 mg | ORAL_TABLET | Freq: Every evening | ORAL | Status: DC | PRN
  Administered 2024-06-04: 3 mg via ORAL
  Filled 2024-06-04: qty 3

## 2024-06-04 MED ORDER — CLOPIDOGREL BISULFATE 75 MG PO TABS
75.0000 mg | ORAL_TABLET | Freq: Every day | ORAL | Status: DC
  Administered 2024-06-04 – 2024-06-05 (×2): 75 mg via ORAL
  Filled 2024-06-04 (×2): qty 1

## 2024-06-04 MED ORDER — IOHEXOL 350 MG/ML SOLN
75.0000 mL | Freq: Once | INTRAVENOUS | Status: AC | PRN
  Administered 2024-06-04: 75 mL via INTRAVENOUS

## 2024-06-04 MED ORDER — TRAZODONE HCL 50 MG PO TABS
100.0000 mg | ORAL_TABLET | Freq: Every day | ORAL | Status: DC
  Administered 2024-06-04: 100 mg via ORAL
  Filled 2024-06-04: qty 2

## 2024-06-04 MED ORDER — ENOXAPARIN SODIUM 60 MG/0.6ML IJ SOSY
45.0000 mg | PREFILLED_SYRINGE | INTRAMUSCULAR | Status: DC
  Administered 2024-06-04: 45 mg via SUBCUTANEOUS
  Filled 2024-06-04: qty 0.6

## 2024-06-04 MED ORDER — ACETAMINOPHEN 160 MG/5ML PO SOLN: 650.0000 mg | ORAL | Status: DC | PRN

## 2024-06-04 MED ORDER — STROKE: EARLY STAGES OF RECOVERY BOOK: Freq: Once | Status: AC

## 2024-06-04 MED ORDER — ACETAMINOPHEN 325 MG PO TABS: 650.0000 mg | ORAL_TABLET | ORAL | Status: DC | PRN

## 2024-06-04 MED ORDER — INSULIN ASPART 100 UNIT/ML IJ SOLN
0.0000 [IU] | Freq: Three times a day (TID) | INTRAMUSCULAR | Status: DC
  Administered 2024-06-05: 2 [IU] via SUBCUTANEOUS
  Filled 2024-06-04: qty 1

## 2024-06-04 MED ORDER — POTASSIUM CHLORIDE 20 MEQ PO PACK
40.0000 meq | PACK | Freq: Once | ORAL | Status: AC
  Administered 2024-06-04: 40 meq via ORAL
  Filled 2024-06-04: qty 2

## 2024-06-04 MED ORDER — SENNOSIDES-DOCUSATE SODIUM 8.6-50 MG PO TABS: 1.0000 | ORAL_TABLET | Freq: Every evening | ORAL | Status: DC | PRN

## 2024-06-04 MED ORDER — ACETAMINOPHEN 650 MG RE SUPP: 650.0000 mg | RECTAL | Status: DC | PRN

## 2024-06-04 NOTE — Progress Notes (Signed)
 Patient requesting night time sleep aids and also states he has not eaten or had anything to drink today.  Provider notified.

## 2024-06-04 NOTE — ED Triage Notes (Signed)
 Pt arrived via [facility] from home c/o right side weakness, facial droop, dysarthria, and reports he has fallen twice today. Pt reports he noticed symptoms when he woke up this morning at 0830 and LKW was last night when he went to bed. Pt denies blood thinners.

## 2024-06-04 NOTE — H&P (Signed)
 " History and Physical    Samuel Fowler:[id number] DOB: 12/17/1955 DOA: 06/04/2024  PCP: [facility]   Patient coming from: Home  I have personally briefly reviewed patient's old medical records in [facility]  Chief Complaint: Right sided weakness  HPI: Samuel Fowler is a 69 y.o. male with medical history significant for hypertension, dyslipidemia.  Patient was brought to the ED with reports of right-sided weakness, with subsequent falls, facial droop, and slurred speech. Patient was last known normal yesterday night at about 10 PM.  Woke up this morning with weakness to his right side, drooling from the right side of his face, and slurred speech.  At the time of my evaluation, slurred speech has improved.  He tells me the weakness was noticed right lower extremity only and not his upper extremity.  No known prior history of stroke.  He is not on aspirin daily, he reports allergy to aspirin.  ED Course: Blood pressure 130s to 150s. UDS positive for benzo and THC. Head CT- Asymmetric hypoattenuation and loss of gray-white differentiation involving the left lentiform nucleus with possible extension into the left internal capsule, concerning for acute infarct. CTA head and neck no acute large vessel occlusion,  Atherosclerosis at the right carotid bifurcation resulting in approximately 60% stenosis at the origin of the right cervical ICA.  Review of Systems: As per HPI all other systems reviewed and negative.  Past Medical History:  Diagnosis Date   Diabetes mellitus without complication (HCC)    Hyperlipidemia     Past Surgical History:  Procedure Laterality Date   BACK SURGERY     CHOLECYSTECTOMY       reports that he has never smoked. He has never used smokeless tobacco. He reports that he does not currently use alcohol. He reports current drug use. Drug: Marijuana.  Allergies[1]  Family history of hypertension.  Prior to Admission medications   Medication Sig Start Date End Date Taking? Authorizing Provider  doxycycline  (VIBRAMYCIN ) 100 MG capsule Take 1 capsule (100 mg total) by mouth 2 (two) times daily. 01/19/19   Carita Senior, MD    Physical Exam: Vitals:   06/04/24 1545 06/04/24 1546  BP: (!) 157/93   Pulse: (!) 112   Resp: 16   Temp: 98.2 F (36.8 C)   TempSrc: Oral   SpO2: 96%   Weight:  95.3 kg  Height:  5' 8 (1.727 m)    Constitutional: NAD, calm, comfortable Vitals:   06/04/24 1545 06/04/24 1546  BP: (!) 157/93   Pulse: (!) 112   Resp: 16   Temp: 98.2 F (36.8 C)   TempSrc: Oral   SpO2: 96%   Weight:  95.3 kg  Height:  5' 8 (1.727 m)   Eyes: PERRL, lids and conjunctivae normal ENMT: Mucous membranes are moist. Posterior pharynx clear of any exudate or lesions.Normal dentition.  Neck: normal, supple, no masses, no thyromegaly Respiratory: clear to auscultation bilaterally, no wheezing, no crackles. Normal respiratory effort. No accessory muscle use.  Cardiovascular: Regular rate and rhythm, no murmurs / rubs / gallops. No extremity edema.  Extremities warm. Abdomen: no tenderness, no masses palpated. No hepatosplenomegaly. Bowel sounds positive.  Musculoskeletal: no clubbing / cyanosis. No joint deformity upper and lower extremities.  Skin: no rashes, lesions, ulcers. No induration Neurologic: Speech slightly slurred, but understandable, per daughter not quite at baseline yet but improved from prior.  Flattening of right facial nasolabial fold, bilateral 5 with good grip strength right upper extremity, 4+  out of 5 strength right lower extremity, 5 out of 5 strength to left upper and lower extremity. Psychiatric: Normal judgment and insight. Alert and oriented x 3. Normal mood.   Labs on Admission: I have personally reviewed following labs and imaging studies  CBC: Recent Labs  Lab 06/04/24 1614 06/04/24 1632  WBC 11.3*  --   NEUTROABS 8.8*  --   HGB 13.8 13.3  HCT 40.7 39.0  MCV 87.0  --    PLT 207  --    Basic Metabolic Panel: Recent Labs  Lab 06/04/24 1614 06/04/24 1632  NA 137 139  K 3.3* 3.4*  CL 99 103  CO2 21*  --   GLUCOSE 163* 152*  BUN 11 11  CREATININE 1.09 1.20  CALCIUM 9.7  --    GFR: Estimated Creatinine Clearance: 66.9 mL/min (by C-G formula based on SCr of 1.2 mg/dL). Liver Function Tests: Recent Labs  Lab 06/04/24 1614  AST 26  ALT 24  ALKPHOS 69  BILITOT 0.6  PROT 7.6  ALBUMIN 4.9   Coagulation Profile: Recent Labs  Lab 06/04/24 1614  INR 1.0   CBG: Recent Labs  Lab 06/04/24 1549  GLUCAP 194*    Radiological Exams on Admission: CT HEAD CODE STROKE WO CONTRAST (LKW 0-4.5h, LVO 0-24h) Result Date: 06/04/2024 EXAM: CT HEAD WITHOUT CONTRAST 06/04/2024 04:09:50 PM TECHNIQUE: CT of the head was performed without the administration of intravenous contrast. Automated exposure control, iterative reconstruction, and/or weight based adjustment of the mA/kV was utilized to reduce the radiation dose to as low as reasonably achievable. COMPARISON: None available. CLINICAL HISTORY: Neuro deficit, acute, stroke suspected. FINDINGS: BRAIN AND VENTRICLES: There is asymmetric hypoattenuation and loss of gray white differentiation involving the left lentiform nucleus with possible extension into the left internal capsule. Atherosclerosis of the carotid siphons. No acute hemorrhage. No hydrocephalus. No extra-axial collection. No mass effect or midline shift. ORBITS: Bilateral lens replacement. SINUSES: Scattered mucosal thickening throughout the paranasal sinuses particularly within the ethmoid and maxillary sinuses. SOFT TISSUES AND SKULL: No acute soft tissue abnormality. No skull fracture. ASPECT SCORE: Ganglionic (caudate, IC, lentiform nucleus, insula, M1-M3): 5 Supraganglionic (M4-M6): 3 Total: 8 IMPRESSION: 1. Asymmetric hypoattenuation and loss of gray-white differentiation involving the left lentiform nucleus with possible extension into the left  internal capsule, concerning for acute infarct. 2. [place] Stroke Program Early CT (ASPECT) score is 8. 3. Findings discussed with Dr. Melvenia at 4:17 PM on 06/04/24. Electronically signed by: Donnice Mania MD MD 06/04/2024 04:18 PM EST RP Workstation: [id number]   EKG: Independently reviewed.  Sinus tachycardia rate 107, PACs present.  No significant abnormalities no prior EKG to compare.  Assessment/Plan Principal Problem:   Acute CVA (cerebrovascular accident) (HCC) Active Problems:   DM (diabetes mellitus) (HCC)  Assessment and Plan:  Acute CVA-presented with right lower extremity weakness, slurred speech-improving, slight flattening of right nasolabial fold.  4+/5 strength to-right lower extremity, can lift against gravity but not sustained, compared to left.  No history of stroke.  Not on aspirin- allergic. - Head CT-concerning for acute stroke-left lentiform nucleus possibly extending into the left internal capsule - MRI brain-  Acute perforator infarct in the left basal ganglia.  - CTA head and neck- no large vessel occlusion. - Teleneurology was consulted, admit here for stroke workup. - Plavix  81 mg daily, patient allergic to aspirin - Lipid panel, hemoglobin A1c in a.m. - PT OT speech therapy eval - Swallow eval - Echocardiogram - CTA neck done, defer carotids.  Mild hypokalemia potassium 3.3 - Replete.  Diabetes mellitus - Hemoglobin A1c - SSI- S - Hold metformin  Insomnia on Xanax  3 mg nightly, and trazodone  resume  DVT prophylaxis: Lovenox  Code Status: Full  Family Communication: Daughter at bedside Disposition Plan: ~ 1 -2 days Consults called: Neurology Admission status: Obs tele   Author: Tully FORBES Carwin, MD 06/04/2024 9:54 PM  For on call review [email or web address].      [1]  Allergies Allergen Reactions   Aspirin    Penicillins    Prednisolone    "

## 2024-06-04 NOTE — ED Provider Notes (Signed)
 " [place] [facility] AT [facility] Provider Note   CSN: [id number] Arrival date & time: 06/04/24  1517     Patient presents with: Facial Droop   Samuel Fowler is a 69 y.o. male.   HPI Patient presents for strokelike symptoms.  Medical history includes DM, HLD.  His last known well was 10 PM last night.  He woke up this morning at around 8 AM with right-sided facial droop, right-sided weakness.  Family noted him drooling out of the right side of his mouth.  He had slurred speech which has improved since this morning.  Because of his right-sided weakness, he did have several falls.  He has been unable to ambulate normally.  He has had loose stools.    Prior to Admission medications  Medication Sig Start Date End Date Taking? Authorizing Provider  doxycycline  (VIBRAMYCIN ) 100 MG capsule Take 1 capsule (100 mg total) by mouth 2 (two) times daily. 01/19/19   Carita Senior, MD    Allergies: Aspirin, Penicillins, and Prednisolone    Review of Systems  Gastrointestinal:  Positive for diarrhea.  Neurological:  Positive for facial asymmetry, speech difficulty and weakness.  All other systems reviewed and are negative.   Updated Vital Signs BP (!) 157/93 (BP Location: Right Arm)   Pulse (!) 112   Temp 98.2 F (36.8 C) (Oral)   Resp 16   Ht 5' 8 (1.727 m)   Wt 95.3 kg   SpO2 96%   BMI 31.93 kg/m   Physical Exam Vitals and nursing note reviewed.  Constitutional:      General: He is not in acute distress.    Appearance: Normal appearance. He is well-developed. He is not toxic-appearing or diaphoretic.  HENT:     Head: Normocephalic and atraumatic.     Right Ear: External ear normal.     Left Ear: External ear normal.     Nose: Nose normal.     Mouth/Throat:     Mouth: Mucous membranes are moist.  Eyes:     Extraocular Movements: Extraocular movements intact.     Conjunctiva/sclera: Conjunctivae normal.  Cardiovascular:     Rate and Rhythm: Normal  rate and regular rhythm.  Pulmonary:     Effort: Pulmonary effort is normal. No respiratory distress.  Abdominal:     General: There is no distension.     Palpations: Abdomen is soft.     Tenderness: There is no abdominal tenderness.  Musculoskeletal:        General: No swelling. Normal range of motion.     Cervical back: Neck supple.  Skin:    General: Skin is warm and dry.     Capillary Refill: Capillary refill takes less than 2 seconds.     Coloration: Skin is not jaundiced or pale.  Neurological:     Mental Status: He is alert.     Cranial Nerves: Dysarthria and facial asymmetry present.     Sensory: Sensory deficit present.     Motor: Weakness and pronator drift present.     Comments: NIHSS 9  Psychiatric:        Mood and Affect: Mood normal.        Behavior: Behavior normal.     (all labs ordered are listed, but only abnormal results are displayed) Labs Reviewed  CBC WITH DIFFERENTIAL/PLATELET - Abnormal; Notable for the following components:      Result Value   WBC 11.3 (*)    Neutro Abs 8.8 (*)  All other components within normal limits  COMPREHENSIVE METABOLIC PANEL WITH GFR - Abnormal; Notable for the following components:   Potassium 3.3 (*)    CO2 21 (*)    Glucose, Bld 163 (*)    Anion gap 17 (*)    All other components within normal limits  URINE DRUG SCREEN - Abnormal; Notable for the following components:   Benzodiazepines POSITIVE (*)    Tetrahydrocannabinol POSITIVE (*)    All other components within normal limits  CBG MONITORING, ED - Abnormal; Notable for the following components:   Glucose-Capillary 194 (*)    All other components within normal limits  I-STAT CHEM 8, ED - Abnormal; Notable for the following components:   Potassium 3.4 (*)    Glucose, Bld 152 (*)    TCO2 20 (*)    All other components within normal limits  PROTIME-INR  ETHANOL  CBG MONITORING, ED    EKG: EKG Interpretation Date/Time:  Wednesday June 04 2024 15:48:13  EST Ventricular Rate:  107 PR Interval:  176 QRS Duration:  88 QT Interval:  324 QTC Calculation: 432 R Axis:   -3  Text Interpretation: Sinus tachycardia with Premature atrial complexes with Abberant conduction Otherwise normal ECG Confirmed by Melvenia Motto ([id number]) on 06/04/2024 4:25:38 PM  Radiology: CT HEAD CODE STROKE WO CONTRAST (LKW 0-4.5h, LVO 0-24h) Result Date: 06/04/2024 EXAM: CT HEAD WITHOUT CONTRAST 06/04/2024 04:09:50 PM TECHNIQUE: CT of the head was performed without the administration of intravenous contrast. Automated exposure control, iterative reconstruction, and/or weight based adjustment of the mA/kV was utilized to reduce the radiation dose to as low as reasonably achievable. COMPARISON: None available. CLINICAL HISTORY: Neuro deficit, acute, stroke suspected. FINDINGS: BRAIN AND VENTRICLES: There is asymmetric hypoattenuation and loss of gray white differentiation involving the left lentiform nucleus with possible extension into the left internal capsule. Atherosclerosis of the carotid siphons. No acute hemorrhage. No hydrocephalus. No extra-axial collection. No mass effect or midline shift. ORBITS: Bilateral lens replacement. SINUSES: Scattered mucosal thickening throughout the paranasal sinuses particularly within the ethmoid and maxillary sinuses. SOFT TISSUES AND SKULL: No acute soft tissue abnormality. No skull fracture. ASPECT SCORE: Ganglionic (caudate, IC, lentiform nucleus, insula, M1-M3): 5 Supraganglionic (M4-M6): 3 Total: 8 IMPRESSION: 1. Asymmetric hypoattenuation and loss of gray-white differentiation involving the left lentiform nucleus with possible extension into the left internal capsule, concerning for acute infarct. 2. [place] Stroke Program Early CT (ASPECT) score is 8. 3. Findings discussed with Dr. Melvenia at 4:17 PM on 06/04/24. Electronically signed by: Donnice Mania MD MD 06/04/2024 04:18 PM EST RP Workstation: [id number]     Procedures   Medications Ordered in  the ED  clopidogrel  (PLAVIX ) tablet 75 mg (has no administration in time range)  iohexol  (OMNIPAQUE ) 350 MG/ML injection 75 mL (75 mLs Intravenous Contrast Given 06/04/24 1744)                                    Medical Decision Making Amount and/or Complexity of Data Reviewed Labs: ordered. Radiology: ordered.  Risk Prescription drug management.   This patient presents to the ED for concern of strokelike symptoms, this involves an extensive number of treatment options, and is a complaint that carries with it a high risk of complications and morbidity.  The differential diagnosis includes CVA, TIA, seizure, metabolic derangements, neoplasm   Co morbidities / Chronic conditions that complicate the patient evaluation  DM, HLD  Additional history obtained:  Additional history obtained from EMR External records from outside source obtained and reviewed including patient's family   Lab Tests:  I Ordered, and personally interpreted labs.  The pertinent results include: Leukocytosis is present.  Mild hypokalemia is present.  UDS positive for THC and benzodiazepines.  Lab work is otherwise unremarkable.   Imaging Studies ordered:  I ordered imaging studies including CT head, CTA head and neck, MRI brain I independently visualized and interpreted imaging which showed noncontrasted CT scan showed asymmetric hypoattenuation involving left lentiform nucleus with possible extension into the left internal capsule concerning for acute infarct.  Remaining imaging pending at time of admission. I agree with the radiologist interpretation   Cardiac Monitoring: / EKG:  The patient was maintained on a cardiac monitor.  I personally viewed and interpreted the cardiac monitored which showed an underlying rhythm of: Sinus rhythm   Problem List / ED Course / Critical interventions / Medication management  Patient presenting for strokelike symptoms.  He noticed these this morning at 8 AM when  he woke up.  His last known well was 10 PM last night.  On arrival in the ED, patient has right-sided deficits consistent with LVO.  NIHSS is 9.  He is alert and oriented at this time.  He confirms no blood thinners.  Code stroke was initiated.  Patient is noncontrasted CT scan did not show evidence of LVO.  There is a concern of acute infarct in area of left lentiform nucleus with possible extension of the left internal capsule.  I spoke with neurologist on-call, Dr. Lindzen, who recommends admission here at [facility] for further stroke workup.  He recommended dose of 75 mg Plavix  and 81 mg aspirin.  Patient has a listed allergy to aspirin.  Will order Plavix  for now.  CTA and MRI ordered.  Patient to be allowed permissive hypertension.  Patient admitted for further management.   Consultations Obtained:  I requested consultation with the neurologist, Dr. Merrianne,  and discussed lab and imaging findings as well as pertinent plan - they recommend: Admission to [facility] for further stroke workup   Social Determinants of Health:  Lives independently     Final diagnoses:  Cerebrovascular accident (CVA), unspecified mechanism [facility])    ED Discharge Orders     None          Melvenia Motto, MD 06/04/24 TRENNA  "

## 2024-06-04 NOTE — Consult Note (Signed)
 [facility] TeleNeurology Consult Services    Date of Service:  06/05/2023     Metrics: Last Known Well: Tuesday night before going to bed at about 9:30 PM Symptoms: As per HPI.  Patient is not a candidate for thrombolytic: Outside of the TNK time window  Location of the provider: [facility]  Location of the patient: [facility] Pre-Morbid Modified Rankin Scale: 0  This consult was provided via telemedicine with 2-way video and audio communication. The patient/family was informed that care would be provided in this way and agreed to receive care in this manner.   ED Physician notified of diagnostic impression and management plan at: 6:05 PM   Assessment: 69 year old male presenting after acute onset of right sided weakness, right facial droop and dysarthria. - Exam reveals findings best localizable to the left MCA territory. NIHSS 5.  - CT head:  Asymmetric hypoattenuation and loss of gray-white differentiation involving the left lentiform nucleus with possible extension into the left internal capsule, concerning for acute infarct. ASPECT score is 8. - CTA of head and neck: No acute large vessel occlusion. Atherosclerosis at the right carotid bifurcation resulting in approximately 60% stenosis at the origin of the right cervical ICA. 3 mm inferiorly directed outpouching along the right supraclinoid ICA at the origin of the right posterior communicating artery, possibly representing an infundibulum versus small aneurysm. 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 - Impression:  - Probable left lentiform nucleus and left internal capsule acute ischemic infarction - Outside of the TNK time window - Not a mechanical thrombectomy candidate due no LVO on CTA.    Recommendations: - Needs admission to [facility] for MRI brain, TTE, cardiac telemetry.  - Start ASA 81 and Plavix  75 with  first doses now.  - Continue permissive HTN until tomorrow AM.  - IV hydration.  - Statin. Obtain baseline CK level - HgbA1c, fasting lipid panel - PT consult, OT consult, Speech consult - Risk factor modification - Frequent neuro checks - NPO until passes stroke swallow screen     ------------------------------------------------------------------------------   History of Present Illness: 69 year old male with a PMHx of DM and HLD who presents to the [facility] ED from home with new symptoms of  right sided weakness, right facial droop and dysarthria. He was unable to get out of bed initially. He later was strong enough to get out of bed but reports that he has fallen twice today. He noticed the above symptoms when he woke up this morning at 0830. LKW was last night when he went to bed. He is not on any blood thinners.      Past Medical History: Past Medical History:  Diagnosis Date   Diabetes mellitus without complication (HCC)    Hyperlipidemia      Past Surgical History: Past Surgical History:  Procedure Laterality Date   BACK SURGERY     CHOLECYSTECTOMY       Medications:  Medications Ordered Prior to Encounter[1]     Social History: Positive for marijuana use No current EtOH use Nonsmoker   Family History:  Reviewed in Epic   ROS: As per HPI    Anticoagulant use:  No   Antiplatelet use: No   Examination:    BP (!) 157/93 (BP Location: Right Arm)   Pulse (!) 112   Temp 98.2 F (36.8 C) (Oral)   Resp 16  Ht 5' 8 (1.727 m)   Wt 95.3 kg   SpO2 96%   BMI 31.93 kg/m     1A: Level of Consciousness - 0 1B: Ask Month and Age - 0 1C: Blink Eyes & Squeeze Hands - 0 2: Test Horizontal Extraocular Movements - 0 3: Test Visual Fields - 0 4: Test Facial Palsy (Use Grimace if Obtunded) - 2 5A: Test Left Arm Motor Drift - 0 5B: Test Right Arm Motor Drift - 0 6A: Test Left Leg Motor Drift - 0 6B: Test Right Leg Motor Drift - 1 (subtle drift) 7: Test Limb  Ataxia (FNF/Heel-Shin) - 1 (mild dysmetria with RLE H-S) 8: Test Sensation -  0 9: Test Language/Aphasia - 0 10: Test Dysarthria - 1 11: Test Extinction/Inattention - 0   NIHSS Score: 5     Patient/Family was informed the Neurology Consult would occur via TeleHealth consult by way of interactive audio and video telecommunications and consented to receiving care in this manner.   Patient is being evaluated for possible acute neurologic impairment and high pretest probability of imminent or life-threatening deterioration. I spent total of 55 minutes providing care to this patient, including time for face to face visit via telemedicine, review of medical records, imaging studies and discussion of findings with providers, the patient and/or family.   Electronically signed: Dr. Camellia Shark     [1]  No current facility-administered medications on file prior to encounter.   Current Outpatient Medications on File Prior to Encounter  Medication Sig Dispense Refill   doxycycline  (VIBRAMYCIN ) 100 MG capsule Take 1 capsule (100 mg total) by mouth 2 (two) times daily. 20 capsule 0

## 2024-06-05 ENCOUNTER — Other Ambulatory Visit (HOSPITAL_COMMUNITY): Payer: Self-pay | Admitting: *Deleted

## 2024-06-05 ENCOUNTER — Encounter (HOSPITAL_COMMUNITY): Payer: Self-pay | Admitting: Internal Medicine

## 2024-06-05 ENCOUNTER — Observation Stay (HOSPITAL_BASED_OUTPATIENT_CLINIC_OR_DEPARTMENT_OTHER)

## 2024-06-05 ENCOUNTER — Telehealth: Payer: Self-pay | Admitting: *Deleted

## 2024-06-05 ENCOUNTER — Observation Stay (HOSPITAL_COMMUNITY)

## 2024-06-05 DIAGNOSIS — I10 Essential (primary) hypertension: Secondary | ICD-10-CM | POA: Diagnosis not present

## 2024-06-05 DIAGNOSIS — I639 Cerebral infarction, unspecified: Secondary | ICD-10-CM

## 2024-06-05 DIAGNOSIS — I6340 Cerebral infarction due to embolism of unspecified cerebral artery: Secondary | ICD-10-CM

## 2024-06-05 LAB — ECHOCARDIOGRAM COMPLETE
Area-P 1/2: 3.85 cm2
Calc EF: 62.6 %
Height: 68 in
S' Lateral: 2.7 cm
Single Plane A2C EF: 62.7 %
Single Plane A4C EF: 63.1 %
Weight: 3368.63 [oz_av]

## 2024-06-05 LAB — LIPID PANEL
Cholesterol: 108 mg/dL (ref 0–200)
HDL: 30 mg/dL — ABNORMAL LOW
LDL Cholesterol: 48 mg/dL (ref 0–99)
Total CHOL/HDL Ratio: 3.7 ratio
Triglycerides: 150 mg/dL — ABNORMAL HIGH
VLDL: 30 mg/dL (ref 0–40)

## 2024-06-05 LAB — HEMOGLOBIN A1C
Hgb A1c MFr Bld: 5.7 % — ABNORMAL HIGH (ref 4.8–5.6)
Mean Plasma Glucose: 116.89 mg/dL

## 2024-06-05 LAB — GLUCOSE, CAPILLARY
Glucose-Capillary: 155 mg/dL — ABNORMAL HIGH (ref 70–99)
Glucose-Capillary: 143 mg/dL — ABNORMAL HIGH (ref 70–99)

## 2024-06-05 LAB — HIV ANTIBODY (ROUTINE TESTING W REFLEX): HIV Screen 4th Generation wRfx: NONREACTIVE

## 2024-06-05 MED ORDER — ROSUVASTATIN CALCIUM 5 MG PO TABS: 5.0000 mg | ORAL_TABLET | Freq: Every day | ORAL | 11 refills | Status: AC

## 2024-06-05 MED ORDER — CLOPIDOGREL BISULFATE 75 MG PO TABS: 75.0000 mg | ORAL_TABLET | Freq: Every day | ORAL | 11 refills | Status: AC

## 2024-06-05 NOTE — Telephone Encounter (Signed)
 Per Dr. Pearlean:  recommend 30-day cardiac monitor to look for paroxysmal A-fib--due to acute Stroke---he is leaving today ---will he need to get it today or will it be mailed to him--

## 2024-06-05 NOTE — Discharge Instructions (Addendum)
 1)Please take Plavix  75 mg daily for stroke Prevention    2)Avoid ibuprofen/Advil/Aleve/Motrin/Goody Powders/Naproxen/BC powders/Meloxicam/Diclofenac/Indomethacin and other Nonsteroidal anti-inflammatory medications as these will make you more likely to bleed and can cause stomach ulcers, can also cause Kidney problems.   3) follow-up with neurology as an outpatient in about 3 months as advised   4) outpatient physical therapy as advised  5) please note that there has been several changes to your medications  6) a heart monitor--to look for irregular heartbeat that may put you at further risk for further strokes and has been mailed to your house

## 2024-06-05 NOTE — Plan of Care (Signed)

## 2024-06-05 NOTE — Progress Notes (Signed)
*  PRELIMINARY RESULTS* Echocardiogram 2D Echocardiogram has been performed.  Teresa Aida PARAS 06/05/2024, 2:29 PM

## 2024-06-05 NOTE — Discharge Summary (Signed)
 "                                                                                  Samuel Fowler, is a 69 y.o. male  DOB [birth year]  MRN [id number].  Admission date:  06/04/2024  Admitting Physician  Tully FORBES Carwin, MD  Discharge Date:  06/05/2024   Primary MD  Practice, [facility]  Recommendations for primary care physician for things to follow:  1)Please take Plavix  75 mg daily for stroke Prevention    2)Avoid ibuprofen/Advil/Aleve/Motrin/Goody Powders/Naproxen/BC powders/Meloxicam/Diclofenac/Indomethacin and other Nonsteroidal anti-inflammatory medications as these will make you more likely to bleed and can cause stomach ulcers, can also cause Kidney problems.   3) follow-up with neurology as an outpatient in about 3 months as advised   4) outpatient physical therapy as advised  5) please note that there has been several changes to your medications  6) a heart monitor--to look for irregular heartbeat that may put you at further risk for further strokes and has been mailed to your house  Admission Diagnosis  Acute CVA (cerebrovascular accident) [facility]) [I63.9] Cerebrovascular accident (CVA), unspecified mechanism (HCC) [I63.9]   Discharge Diagnosis  Acute CVA (cerebrovascular accident) (HCC) [I63.9] Cerebrovascular accident (CVA), unspecified mechanism (HCC) [I63.9]    Principal Problem:   Acute CVA (cerebrovascular accident) (HCC) Active Problems:   DM (diabetes mellitus) (HCC)      Past Medical History:  Diagnosis Date   Diabetes mellitus without complication (HCC)    Hyperlipidemia     Past Surgical History:  Procedure Laterality Date   BACK SURGERY     CHOLECYSTECTOMY       HPI  from the history and physical done on the day of admission:   Chief Complaint: Right sided weakness   HPI: Samuel Fowler is a 69 y.o. male with medical history significant for hypertension, dyslipidemia.  Patient was brought to the ED with reports of right-sided weakness, with  subsequent falls, facial droop, and slurred speech. Patient was last known normal yesterday night at about 10 PM.  Woke up this morning with weakness to his right side, drooling from the right side of his face, and slurred speech.  At the time of my evaluation, slurred speech has improved.  He tells me the weakness was noticed right lower extremity only and not his upper extremity.  No known prior history of stroke.  He is not on aspirin daily, he reports allergy to aspirin.   ED Course: Blood pressure 130s to 150s. UDS positive for benzo and THC. Head CT- Asymmetric hypoattenuation and loss of gray-white differentiation involving the left lentiform nucleus with possible extension into the left internal capsule, concerning for acute infarct. CTA head and neck no acute large vessel occlusion,  Atherosclerosis at the right carotid bifurcation resulting in approximately 60% stenosis at the origin of the right cervical ICA.   Review of Systems: As per HPI all other systems reviewed and negative.   Hospital Course:    Assessment and Plan: 1)Acute CVA-POA -MRI brain-  Acute perforator infarct in the left basal ganglia.  - CTA head and neck- no large vessel occlusion. -On telemetry no significant arrhythmias -Echo with EF  of 60 to 65%, no regional wall motion abnormalities,, borderline concentric LVH noted, no significant valvular abnormalities, no atrial level shunt detected -Neurology consult appreciated -30-day event monitor to rule out A-fib or other significant arrhythmias arranged for patient as outpatient -Patient with aspirin allergy, okay to treat empirically with Plavix  -LDL 48 HDL 30 triglycerides 150 --Even if his lipid panel is within desired limits, patient should still take Crestor /Statin for it's Pleiotropic effects (beyond cholesterol lowering benefits) -continue PTA Crestor  5 mg daily -Speech deficits and right-sided hemiparesis, especially of the Rt lower extremity improving -  Outpatient physical therapy and outpatient neurology follow-up advised-  2)DM2-A1c 5.7 reflecting insulin  diabetic control PTA -Continue PTA metformin starting on 06/06/2024  3) depression/anxiety with insomnia---stable, continue PTA trazodone  and Xanax  -- Discharge Condition: Stable  Follow UP   Follow-up Information     Practice, [facility]. Schedule an appointment as soon as possible for a visit in 1 month(s).   Contact information: [address] [place] [address] [phone]                 Consults obtained -neurology  Diet and Activity recommendation:  As advised  Discharge Instructions    Discharge Instructions     Ambulatory referral to Neurology   Complete by: As directed    An appointment is requested in approximately: 4 weeks   Ambulatory referral to Physical Therapy   Complete by: As directed    Call MD for:  difficulty breathing, headache or visual disturbances   Complete by: As directed    Call MD for:  persistant dizziness or light-headedness   Complete by: As directed    Call MD for:  temperature >100.4   Complete by: As directed    Diet - low sodium heart healthy   Complete by: As directed    Diet Carb Modified   Complete by: As directed    Discharge instructions   Complete by: As directed    1)Please take Plavix  75 mg daily for stroke Prevention    2)Avoid ibuprofen/Advil/Aleve/Motrin/Goody Powders/Naproxen/BC powders/Meloxicam/Diclofenac/Indomethacin and other Nonsteroidal anti-inflammatory medications as these will make you more likely to bleed and can cause stomach ulcers, can also cause Kidney problems.   3) follow-up with neurology as an outpatient in about 3 months as advised   4) outpatient physical therapy as advised  5) please note that there has been several changes to your medications  6) a heart monitor--to look for irregular heartbeat that may put you at further risk for further strokes and has been mailed to your house    Increase activity slowly   Complete by: As directed        Discharge Medications     Allergies as of 06/05/2024       Reactions   Bee Pollen Anaphylaxis   BEE STINGS   Penicillin G Procaine    Penicillins Hives   unknown   Prednisolone    Pt stated mess with eyes   Aspirin Itching, Rash        Medication List     STOP taking these medications    doxycycline  100 MG capsule Commonly known as: VIBRAMYCIN        TAKE these medications    ALPRAZolam  1 MG tablet Commonly known as: XANAX  Take 3 mg by mouth at bedtime as needed for anxiety.   clopidogrel  75 MG tablet Commonly known as: PLAVIX  Take 1 tablet (75 mg total) by mouth daily. For stroke prevention Start taking on: June 06, 2024   metFORMIN 500 MG 24 hr tablet Commonly known as: GLUCOPHAGE-XR Take 2,000 mg by mouth daily.   rosuvastatin  5 MG tablet Commonly known as: CRESTOR  Take 1 tablet (5 mg total) by mouth daily.   traZODone  100 MG tablet Commonly known as: DESYREL  Take 100 mg by mouth at bedtime.       Major procedures and Radiology Reports - PLEASE review detailed and final reports for all details, in brief -   ECHOCARDIOGRAM COMPLETE Result Date: 06/05/2024    ECHOCARDIOGRAM REPORT   Patient Name:   Samuel Fowler Date of Exam: 06/05/2024 Medical Rec #:  [id number]     Height:       68.0 in Accession #:    [id number]    Weight:       210.5 lb Date of Birth:  [birth year]     BSA:          2.089 m Patient Age:    68 years      BP:           102/55 mmHg Patient Gender: M             HR:           73 bpm. Exam Location:  [facility] Procedure: 2D Echo, Cardiac Doppler, Color Doppler and Strain Analysis (Both            Spectral and Color Flow Doppler were utilized during procedure). Indications:    Stroke l63.9  History:        Patient has no prior history of Echocardiogram examinations.                 Risk Factors:Hypertension, Diabetes and Dyslipidemia.  Sonographer:    Aida Pizza RCS Referring  Phys: [phone] TULLY FORBES Fowler  Sonographer Comments: Global longitudinal strain was attempted. IMPRESSIONS  1. Left ventricular ejection fraction, by estimation, is 60 to 65%. The left ventricle has normal function. The left ventricle has no regional wall motion abnormalities. Left ventricular diastolic parameters were normal.  2. Right ventricular systolic function is normal. The right ventricular size is normal. Tricuspid regurgitation signal is inadequate for assessing PA pressure.  3. The mitral valve is grossly normal. Trivial mitral valve regurgitation.  4. The aortic valve is tricuspid. Aortic valve regurgitation is not visualized.  5. The inferior vena cava is normal in size with greater than 50% respiratory variability, suggesting right atrial pressure of 3 mmHg. Comparison(s): No prior Echocardiogram. FINDINGS  Left Ventricle: Left ventricular ejection fraction, by estimation, is 60 to 65%. The left ventricle has normal function. The left ventricle has no regional wall motion abnormalities. The left ventricular internal cavity size was normal in size. There is  borderline concentric left ventricular hypertrophy. Left ventricular diastolic parameters were normal. Right Ventricle: The right ventricular size is normal. No increase in right ventricular wall thickness. Right ventricular systolic function is normal. Tricuspid regurgitation signal is inadequate for assessing PA pressure. Left Atrium: Left atrial size was normal in size. Right Atrium: Right atrial size was normal in size. Pericardium: Trivial pericardial effusion is present. The pericardial effusion is posterior to the left ventricle. Mitral Valve: The mitral valve is grossly normal. Trivial mitral valve regurgitation. Tricuspid Valve: The tricuspid valve is grossly normal. Tricuspid valve regurgitation is trivial. Aortic Valve: The aortic valve is tricuspid. Aortic valve regurgitation is not visualized. Pulmonic Valve: The pulmonic valve was  grossly normal. Pulmonic valve regurgitation is mild. Aorta: The aortic root is normal  in size and structure. Venous: The inferior vena cava is normal in size with greater than 50% respiratory variability, suggesting right atrial pressure of 3 mmHg. IAS/Shunts: The interatrial septum appears to be lipomatous. No atrial level shunt detected by color flow Doppler. Additional Comments: 3D was performed not requiring image post processing on an independent workstation and was indeterminate.  LEFT VENTRICLE PLAX 2D LVIDd:         4.50 cm     Diastology LVIDs:         2.70 cm     LV e' medial:    10.40 cm/s LV PW:         1.00 cm     LV E/e' medial:  8.0 LV IVS:        1.00 cm     LV e' lateral:   8.21 cm/s LVOT diam:     2.10 cm     LV E/e' lateral: 10.2 LV SV:         73 LV SV Index:   35 LVOT Area:     3.46 cm  LV Volumes (MOD) LV vol d, MOD A2C: 65.6 ml LV vol d, MOD A4C: 88.5 ml LV vol s, MOD A2C: 24.5 ml LV vol s, MOD A4C: 32.7 ml LV SV MOD A2C:     41.1 ml LV SV MOD A4C:     88.5 ml LV SV MOD BP:      47.8 ml RIGHT VENTRICLE RV S prime:     15.70 cm/s TAPSE (M-mode): 2.3 cm LEFT ATRIUM             Index        RIGHT ATRIUM           Index LA diam:        3.30 cm 1.58 cm/m   RA Area:     16.20 cm LA Vol (A2C):   24.1 ml 11.54 ml/m  RA Volume:   43.60 ml  20.87 ml/m LA Vol (A4C):   48.0 ml 22.98 ml/m LA Biplane Vol: 34.1 ml 16.32 ml/m  AORTIC VALVE LVOT Vmax:   108.00 cm/s LVOT Vmean:  70.500 cm/s LVOT VTI:    0.211 m  AORTA Ao Root diam: 3.30 cm MITRAL VALVE MV Area (PHT): 3.85 cm    SHUNTS MV Decel Time: 197 msec    Systemic VTI:  0.21 m MV E velocity: 83.40 cm/s  Systemic Diam: 2.10 cm MV A velocity: 70.90 cm/s MV E/A ratio:  1.18 Samuel Sierras MD Electronically signed by Samuel Sierras MD Signature Date/Time: 06/05/2024/2:33:05 PM    Final    MR BRAIN WO CONTRAST Result Date: 06/04/2024 EXAM: MRI BRAIN WITHOUT CONTRAST 06/04/2024 06:51:24 PM TECHNIQUE: Multiplanar multisequence MRI of the head/brain  was performed without the administration of intravenous contrast. COMPARISON: CT Head earlier today. CLINICAL HISTORY: Neuro deficit, acute, stroke suspected FINDINGS: BRAIN AND VENTRICLES: Acute perforator infarct in the left basal ganglia . No intracranial hemorrhage. No mass. No midline shift. No hydrocephalus. The sella is unremarkable. Normal flow voids. ORBITS: No significant abnormality. SINUSES AND MASTOIDS: Paranasal sinus mucosal thickening. No mastoid effusions. BONES AND SOFT TISSUES: Normal marrow signal. IMPRESSION: 1. Acute perforator infarct in the left basal ganglia. Electronically signed by: Samuel Molt MD 06/04/2024 08:27 PM EST RP Workstation: [id number]   CT ANGIO HEAD NECK W WO CM Result Date: 06/04/2024 EXAM: CTA HEAD AND NECK WITH AND WITHOUT 06/04/2024 05:53:20 PM TECHNIQUE: CTA of the head and neck was performed  with and without the administration of 75 mL of iohexol  (OMNIPAQUE ) 350 MG/ML injection. Multiplanar 2D and/or 3D reformatted images are provided for review. Automated exposure control, iterative reconstruction, and/or weight based adjustment of the mA/kV was utilized to reduce the radiation dose to as low as reasonably achievable. Stenosis of the internal carotid arteries measured using NASCET criteria. COMPARISON: CT head dated 06/04/2024 CLINICAL HISTORY: Stroke/TIA, determine embolic source. FINDINGS: CTA NECK: AORTIC ARCH AND ARCH VESSELS: The aortic arch is not visualized. The left vertebral artery likely originates on the aortic arch. Atherosclerosis along the proximal right subclavian artery without significant stenosis. There is an additional small focus of noncalcified atherosclerosis along the proximal left subclavian artery without significant stenosis. No dissection or arterial injury. CERVICAL CAROTID ARTERIES: Atherosclerosis at the right carotid bifurcation resulting in approximately 60% stenosis at the origin of the right cervical ICA. Additional scattered  atherosclerosis along the right cervical ICA. Atherosclerosis along the proximal left cervical ICA without hemodynamically significant stenosis. No dissection or arterial injury. CERVICAL VERTEBRAL ARTERIES: The left vertebral artery origin is not visualized. Visualized portions of the vertebral arteries are patent to the vertebrobasilar confluence. No dissection, arterial injury, or significant stenosis. LUNGS AND MEDIASTINUM: Unremarkable. SOFT TISSUES: Poor dentition, dental caries. No acute abnormality. BONES: Degenerative changes in the visualized spine. No acute abnormality. CTA HEAD: ANTERIOR CIRCULATION: Mild atherosclerosis of the carotid siphons without significant stenosis. There is a 3 mm inferiorly directed outpouching along the right supraclinoid right ICA at the origin of the right posterior communicating artery which may reflect an infundibulum versus small aneurysm. Additional 4 mm inferiorly directed outpouching along the left supraclinoid ICA compatible with aneurysm based on size and appearance. No significant stenosis of the anterior cerebral arteries. No significant stenosis of the middle cerebral arteries. POSTERIOR CIRCULATION: No significant stenosis of the posterior cerebral arteries. No significant stenosis of the basilar artery. No significant stenosis of the vertebral arteries. No aneurysm. OTHER: No dural venous sinus thrombosis on this non-dedicated study. IMPRESSION: 1. No acute large vessel occlusion. 2. Atherosclerosis at the right carotid bifurcation resulting in approximately 60% stenosis at the origin of the right cervical ICA. 3. 3 mm inferiorly directed outpouching along the right supraclinoid ICA at the origin of the right posterior communicating artery, possibly representing an infundibulum versus small aneurysm. 4. 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 Electronically signed by: Samuel Mania MD MD 06/04/2024 06:59 PM EST RP Workstation: [id number]   CT HEAD CODE STROKE WO CONTRAST (LKW 0-4.5h, LVO 0-24h) Result Date: 06/04/2024 EXAM: CT HEAD WITHOUT CONTRAST 06/04/2024 04:09:50 PM TECHNIQUE: CT of the head was performed without the administration of intravenous contrast. Automated exposure control, iterative reconstruction, and/or weight based adjustment of the mA/kV was utilized to reduce the radiation dose to as low as reasonably achievable. COMPARISON: None available. CLINICAL HISTORY: Neuro deficit, acute, stroke suspected. FINDINGS: BRAIN AND VENTRICLES: There is asymmetric hypoattenuation and loss of gray white differentiation involving the left lentiform nucleus with possible extension into the left internal capsule. Atherosclerosis of the carotid siphons. No acute hemorrhage. No hydrocephalus. No extra-axial collection. No mass effect or midline shift. ORBITS: Bilateral lens replacement. SINUSES: Scattered mucosal thickening throughout the paranasal sinuses particularly within the ethmoid and maxillary sinuses. SOFT TISSUES AND SKULL: No acute soft tissue abnormality. No skull fracture. ASPECT SCORE: Ganglionic (caudate, IC, lentiform nucleus, insula, M1-M3): 5 Supraganglionic (M4-M6): 3 Total: 8 IMPRESSION: 1. Asymmetric hypoattenuation and loss of  gray-white differentiation involving the left lentiform nucleus with possible extension into the left internal capsule, concerning for acute infarct. 2. [place] Stroke Program Early CT (ASPECT) score is 8. 3. Findings discussed with Dr. Melvenia at 4:17 PM on 06/04/24. Electronically signed by: Samuel Mania MD MD 06/04/2024 04:18 PM EST RP Workstation: [id number]   Today   Subjective    Samuel Fowler today has no new complaints No fever  Or chills   No Nausea, Vomiting or Diarrhea Spoke with wife by phone, questions answered Speech deficits and right-sided hemiparesis, especially of the Rt lower extremity improving   Patient has been  seen and examined prior to discharge   Objective   Blood pressure (!) 102/55, pulse 73, temperature 97.7 F (36.5 C), temperature source Axillary, resp. rate 18, height 5' 8 (1.727 m), weight 95.5 kg, SpO2 97%.   Intake/Output Summary (Last 24 hours) at 06/05/2024 1513 Last data filed at 06/05/2024 0800 Gross per 24 hour  Intake 420 ml  Output 300 ml  Net 120 ml    Exam Gen:- Awake Alert, no acute distress  HEENT:- [place].AT, No sclera icterus Neck-Supple Neck,No JVD,.  Lungs-  CTAB , good air movement bilaterally CV- S1, S2 normal, regular Abd-  +ve B.Sounds, Abd Soft, No tenderness,    Extremity/Skin:- No  edema,   good pulses Psych-affect is appropriate, oriented x3 Neuro-  no tremors , Speech deficits and right-sided hemiparesis, especially of the Rt lower extremity improving   Data Review   CBC w Diff:  Lab Results  Component Value Date   WBC 11.3 (H) 06/04/2024   HGB 13.3 06/04/2024   HCT 39.0 06/04/2024   PLT 207 06/04/2024   LYMPHOPCT 13 06/04/2024   MONOPCT 7 06/04/2024   EOSPCT 2 06/04/2024   BASOPCT 0 06/04/2024   CMP:  Lab Results  Component Value Date   NA 139 06/04/2024   K 3.4 (L) 06/04/2024   CL 103 06/04/2024   CO2 21 (L) 06/04/2024   BUN 11 06/04/2024   CREATININE 1.20 06/04/2024   PROT 7.6 06/04/2024   ALBUMIN 4.9 06/04/2024   BILITOT 0.6 06/04/2024   ALKPHOS 69 06/04/2024   AST 26 06/04/2024   ALT 24 06/04/2024  .  Total Discharge time is about 33 minutes  Samuel Fowler M.D on 06/05/2024 at 3:13 PM  Go to [email or web address] -  for contact info  [facility] - Office  [phone]   "

## 2024-06-05 NOTE — Progress Notes (Signed)
 I connected with  Samuel Fowler on 06/05/2024 by a video enabled telemedicine application and verified that I am speaking with the correct person using two identifiers.   I discussed the limitations of evaluation and management by telemedicine. The patient expressed understanding and agreed to proceed.  Location of patient: [facility] Location of physician: [facility]   Subjective: No acute events overnight.  Would like to go home.  No new concerns.  ROS: negative except above  Examination  Vital signs in last 24 hours: Temp:  [97.7 F (36.5 C)-98.3 F (36.8 C)] 97.7 F (36.5 C) (01/08 0345) Pulse Rate:  [73-112] 73 (01/08 0345) Resp:  [16-18] 18 (01/08 0345) BP: (102-157)/(55-93) 102/55 (01/08 0345) SpO2:  [95 %-98 %] 97 % (01/08 0345) Weight:  [95.3 kg-95.5 kg] 95.5 kg (01/07 1945)  General: In bed, not in apparent distress Neuro: MS: Alert, oriented, follows commands, no no aphasia, mild to moderate dysarthria CN: Right facial droop, rest of the cranial nerves appear grossly intact Motor: Antigravity strength in all extremities with right upper extremity drift Sensory: Intact to light touch Coordination: Slight dysmetria in right upper extremity Gait: not tested  Basic Metabolic Panel: Recent Labs  Lab 06/04/24 1614 06/04/24 1632  NA 137 139  K 3.3* 3.4*  CL 99 103  CO2 21*  --   GLUCOSE 163* 152*  BUN 11 11  CREATININE 1.09 1.20  CALCIUM  9.7  --     CBC: Recent Labs  Lab 06/04/24 1614 06/04/24 1632  WBC 11.3*  --   NEUTROABS 8.8*  --   HGB 13.8 13.3  HCT 40.7 39.0  MCV 87.0  --   PLT 207  --      Coagulation Studies: Recent Labs    06/04/24 1614  LABPROT 13.3  INR 1.0    Imaging personally reviewed   CT head without contrast 06/04/2024:  Asymmetric hypoattenuation and loss of gray-white differentiation involving the left lentiform nucleus with possible extension into the left internal capsule, concerning for acute infarct.  [place] Stroke Program Early CT (ASPECT) score is 8.  CTA head and neck with and without contrast 06/04/2024: No acute large vessel occlusion. Atherosclerosis at the right carotid bifurcation resulting in approximately 60% stenosis at the origin of the right cervical ICA. 3 mm inferiorly directed outpouching along the right supraclinoid ICA at the origin of the right posterior communicating artery, possibly representing an infundibulum versus small aneurysm. 4 mm inferiorly directed outpouching along the left supraclinoid ICA,compatible with aneurysm based on size and appearance.   MRI brain without contrast 06/04/2024: Aute perforator infarct in the left basal ganglia.    ASSESSMENT AND PLAN: 69 year old male presented with right-sided weakness.  MRI brain showed left basal ganglia stroke.  Acute ischemic stroke Etiology: Likely embolic   Recommendations - Patient states he is allergic to aspirin.  Therefore would recommend Plavix  75 mg daily - LDL only 48 therefore not starting atorvastatin -TTE pending.  If negative recommend 30-day cardiac monitor to look for paroxysmal A-fib - Goal blood pressure: Gradual normotension - Modification of secondary stroke risk factors - PT/OT/speech therapy - Follow-up with neurology in 4 to 5 weeks (order placed) - Discussed plan with Dr. Pearlean via secure chat    I personally spent a total of 36 minutes in the care of the patient today including getting/reviewing separately obtained history, performing a medically appropriate exam/evaluation, counseling and educating, placing orders, referring and communicating with other health care professionals, documenting clinical information  in the EHR, independently interpreting results, and coordinating care.         Arlin Krebs Epilepsy [facility] For questions after 5pm please refer to AMION to reach the Neurologist on call

## 2024-06-05 NOTE — Progress Notes (Signed)
" °  Transition of Care [facility]) Screening Note   Patient Details  Name: Samuel Fowler Date of Birth: April 27, 1956   Transition of Care [facility]) CM/SW Contact:    Hoy DELENA Bigness, LCSW Phone Number: 06/05/2024, 12:19 PM    Transition of Care Department [facility]) has reviewed patient and no TOC needs have been identified at this time. We will continue to monitor patient advancement through interdisciplinary progression rounds. If new patient transition needs arise, please place a TOC consult.    06/05/24 1219  TOC Brief Assessment  Insurance and Status Reviewed  Patient has primary care physician Yes  Home environment has been reviewed From home  Prior level of function: Independent  Prior/Current Home Services No current home services  Social Drivers of Health Review SDOH reviewed no interventions necessary  Readmission risk has been reviewed Yes  Transition of care needs no transition of care needs at this time    "

## 2024-06-05 NOTE — Evaluation (Signed)
 Occupational Therapy Evaluation Patient Details Name: Samuel Fowler MRN: [id number] DOB: 10-02-1955 Today's Date: 06/05/2024   History of Present Illness   Lerone Burich is a 69 y.o. male with medical history significant for hypertension, dyslipidemia.  Patient was brought to the ED with reports of right-sided weakness, with subsequent falls, facial droop, and slurred speech.  Patient was last known normal yesterday night at about 10 PM.  Woke up this morning with weakness to his right side, drooling from the right side of his face, and slurred speech.  At the time of my evaluation, slurred speech has improved.  He tells me the weakness was noticed right lower extremity only and not his upper extremity.  No known prior history of stroke.  He is not on aspirin daily, he reports allergy to aspirin. (per MD)     Clinical Impressions Pt is likely near baseline function. R UE limited in shoulder A/ROM at baseline. No other B UE deficits noted. Pt was able to functionally ambulate without AD with a limp that he reports having at baseline. Pt reports difficulty with socks at baseline. Pt left in the bed with call bell within reach. Pt is not recommended for any further acute OT services and will be discharged to care of nursing staff for remaining length of stay.       If plan is discharge home, recommend the following:   Assist for transportation     Functional Status Assessment   Patient has not had a recent decline in their functional status     Equipment Recommendations   None recommended by OT             Precautions/Restrictions   Precautions Precautions: Fall Recall of Precautions/Restrictions: Intact Restrictions Weight Bearing Restrictions Per Provider Order: No     Mobility Bed Mobility Overal bed mobility: Independent                  Transfers Overall transfer level: Modified independent                 General transfer comment: Mod I to  supervision for transfers without AD.      Balance Overall balance assessment: Mild deficits observed, not formally tested                                         ADL either performed or assessed with clinical judgement   ADL Overall ADL's : Modified independent (No physical assist needed for ADL's. Socks are reportedly difficult for the pt at baseline. Pt likely near baseline function.)                                             Vision Baseline Vision/History: 1 Wears glasses Ability to See in Adequate Light: 0 Adequate Patient Visual Report: No change from baseline Vision Assessment?: Wears glasses for reading     Perception Perception: Not tested       Praxis Praxis: Not tested       Pertinent Vitals/Pain Pain Assessment Pain Assessment: No/denies pain     Extremity/Trunk Assessment Upper Extremity Assessment Upper Extremity Assessment: RUE deficits/detail;Left hand dominant RUE Deficits / Details: 3-/5 shoulder flexion for over a year after a fall. WFL otherwise. RUE Sensation: WNL RUE Coordination: WNL  Lower Extremity Assessment Lower Extremity Assessment: Defer to PT evaluation   Cervical / Trunk Assessment Cervical / Trunk Assessment: Kyphotic   Communication Communication Communication: No apparent difficulties   Cognition Arousal: Alert Behavior During Therapy: WFL for tasks assessed/performed Cognition: No apparent impairments                               Following commands: Intact       Cueing  General Comments   Cueing Techniques: Verbal cues                 Home Living Family/patient expects to be discharged to:: Private residence Living Arrangements: Spouse/significant other;Children Available Help at Discharge: Family;Available 24 hours/day Type of Home: House Home Access: Stairs to enter [employer] of Steps: 2 Entrance Stairs-Rails: None Home Layout: One level      Bathroom Shower/Tub: Chief Strategy Officer: Standard Bathroom Accessibility: Yes How Accessible: Accessible via wheelchair;Accessible via walker Home Equipment: Rolling Walker (2 wheels);Cane - single point;Wheelchair - manual      Lives With: Spouse;Family    Prior Functioning/Environment Prior Level of Function : Needs assist       Physical Assist : ADLs (physical)   ADLs (physical): IADLs Mobility Comments: Ambualtes without AD but does have a limp at baseline. ADLs Comments: Independent ADL's; assist IADL's.                            Co-evaluation PT/OT/SLP Co-Evaluation/Treatment: Yes Reason for Co-Treatment: To address functional/ADL transfers   OT goals addressed during session: ADL's and self-care      AM-PAC OT 6 Clicks Daily Activity     Outcome Measure Help from another person eating meals?: None Help from another person taking care of personal grooming?: None Help from another person toileting, which includes using toliet, bedpan, or urinal?: None Help from another person bathing (including washing, rinsing, drying)?: None Help from another person to put on and taking off regular upper body clothing?: None Help from another person to put on and taking off regular lower body clothing?: None 6 Click Score: 24   End of Session    Activity Tolerance: Patient tolerated treatment well Patient left: in bed;with call bell/phone within reach  OT Visit Diagnosis: Unsteadiness on feet (R26.81);Other abnormalities of gait and mobility (R26.89);Other symptoms and signs involving the nervous system (M70.101)                Time: 9147-9090 OT Time Calculation (min): 17 min Charges:  OT General Charges $OT Visit: 1 Visit OT Evaluation $OT Eval Low Complexity: 1 Low  Legaci Tarman OT, MOT  Jayson Person 06/05/2024, 1:27 PM

## 2024-06-05 NOTE — Evaluation (Signed)
 Physical Therapy Evaluation Patient Details Name: Samuel Fowler MRN: [id number] DOB: 09/12/1955 Today's Date: 06/05/2024  History of Present Illness  Samuel Fowler is a 69 y.o. male with medical history significant for hypertension, dyslipidemia.  Patient was brought to the ED with reports of right-sided weakness, with subsequent falls, facial droop, and slurred speech.  Patient was last known normal yesterday night at about 10 PM.  Woke up this morning with weakness to his right side, drooling from the right side of his face, and slurred speech.  At the time of my evaluation, slurred speech has improved.  He tells me the weakness was noticed right lower extremity only and not his upper extremity.  No known prior history of stroke.  He is not on aspirin daily, he reports allergy to aspirin.   Clinical Impression  Patient functioning at baseline for functional mobility and gait demonstrating good return for ambulating in room, hallways without loss of balance or need for an AD. Plan:  Patient discharged from physical therapy to care of nursing for ambulation daily as tolerated for length of stay.          If plan is discharge home, recommend the following: Help with stairs or ramp for entrance   Can travel by private vehicle        Equipment Recommendations None recommended by PT  Recommendations for Other Services       Functional Status Assessment Patient has not had a recent decline in their functional status     Precautions / Restrictions Precautions Precautions: Fall Recall of Precautions/Restrictions: Intact Restrictions Weight Bearing Restrictions Per Provider Order: No      Mobility  Bed Mobility Overal bed mobility: Independent                  Transfers Overall transfer level: Modified independent                 General transfer comment: labored movement for transferring to/from chair    Ambulation/Gait Ambulation/Gait assistance: Supervision,  Modified independent (Device/Increase time) Gait Distance (Feet): 120 Feet Assistive device: None Gait Pattern/deviations: WFL(Within Functional Limits) Gait velocity: slightly decreased     General Gait Details: grossly with WFL with good return for ambulating in room, hallways without loss of balance, only mild baseline limping due to history of back surgery  Stairs            Wheelchair Mobility     Tilt Bed    Modified Rankin (Stroke Patients Only)       Balance Overall balance assessment: Needs assistance Sitting-balance support: Feet supported, No upper extremity supported Sitting balance-Leahy Scale: Good Sitting balance - Comments: seated at EOB   Standing balance support: During functional activity, No upper extremity supported Standing balance-Leahy Scale: Fair Standing balance comment: fair/good without AD                             Pertinent Vitals/Pain Pain Assessment Pain Assessment: No/denies pain    Home Living Family/patient expects to be discharged to:: Private residence Living Arrangements: Spouse/significant other;Children Available Help at Discharge: Family;Available 24 hours/day Type of Home: House Home Access: Stairs to enter Entrance Stairs-Rails: None Entrance Stairs-Number of Steps: 2   Home Layout: One level Home Equipment: [employer]ant (2 wheels);Cane - single point;Wheelchair - manual      Prior Function Prior Level of Function : Needs assist       Physical Assist : ADLs (  physical);Mobility (physical) Mobility (physical): Bed mobility;Transfers;Gait;Stairs ADLs (physical): IADLs Mobility Comments: Ambualtes without AD but does have a limp at baseline. ADLs Comments: Independent ADL's; assist IADL's.     Extremity/Trunk Assessment   Upper Extremity Assessment Upper Extremity Assessment: Defer to OT evaluation RUE Deficits / Details: 3-/5 shoulder flexion for over a year after a fall. WFL otherwise. RUE  Sensation: WNL RUE Coordination: WNL    Lower Extremity Assessment Lower Extremity Assessment: Overall WFL for tasks assessed    Cervical / Trunk Assessment Cervical / Trunk Assessment: Kyphotic  Communication   Communication Communication: No apparent difficulties    Cognition Arousal: Alert Behavior During Therapy: WFL for tasks assessed/performed                             Following commands: Intact       Cueing Cueing Techniques: Verbal cues     General Comments      Exercises     Assessment/Plan    PT Assessment Patient does not need any further PT services  PT Problem List         PT Treatment Interventions      PT Goals (Current goals can be found in the Care Plan section)  Acute Rehab PT Goals Patient Stated Goal: return home with family to assist PT Goal Formulation: With patient Time For Goal Achievement: 06/05/24 Potential to Achieve Goals: Good    Frequency       Co-evaluation PT/OT/SLP Co-Evaluation/Treatment: Yes Reason for Co-Treatment: To address functional/ADL transfers PT goals addressed during session: Mobility/safety with mobility;Balance OT goals addressed during session: ADL's and self-care       AM-PAC PT 6 Clicks Mobility  Outcome Measure Help needed turning from your back to your side while in a flat bed without using bedrails?: None Help needed moving from lying on your back to sitting on the side of a flat bed without using bedrails?: None Help needed moving to and from a bed to a chair (including a wheelchair)?: None Help needed standing up from a chair using your arms (e.g., wheelchair or bedside chair)?: None Help needed to walk in hospital room?: None Help needed climbing 3-5 steps with a railing? : A Little 6 Click Score: 23    End of Session   Activity Tolerance: Patient tolerated treatment well Patient left: in chair;with call bell/phone within reach Nurse Communication: Mobility status PT Visit  Diagnosis: Unsteadiness on feet (R26.81);Other abnormalities of gait and mobility (R26.89);Muscle weakness (generalized) (M62.81)    Time: 0850-0909 PT Time Calculation (min) (ACUTE ONLY): 19 min   Charges:   PT Evaluation $PT Eval Low Complexity: 1 Low PT Treatments $Therapeutic Activity: 8-22 mins PT General Charges $$ ACUTE PT VISIT: 1 Visit         1:45 PM, 06/05/2024 Lynwood Music, MPT Physical Therapist with [facility] [phone] office [phone] mobile phone

## 2024-06-05 NOTE — Evaluation (Signed)
 Speech Language Pathology Evaluation Patient Details Name: Samuel Fowler MRN: [id number] DOB: 1956/05/07 Today's Date: 06/05/2024 Time: 0940-1000 SLP Time Calculation (min) (ACUTE ONLY): 20 min  Problem List:  Patient Active Problem List   Diagnosis Date Noted   Acute CVA (cerebrovascular accident) (HCC) 06/04/2024   DM (diabetes mellitus) (HCC) 06/04/2024   Past Medical History:  Past Medical History:  Diagnosis Date   Diabetes mellitus without complication (HCC)    Hyperlipidemia    Past Surgical History:  Past Surgical History:  Procedure Laterality Date   BACK SURGERY     CHOLECYSTECTOMY     HPI:  Samuel Fowler is a 69 y.o. male with medical history significant for hypertension, dyslipidemia.  Patient was brought to the ED with reports of right-sided weakness, with subsequent falls, facial droop, and slurred speech on 06/04/24.  Patient was last known normal yesterday night at about 10 PM.  Woke up this morning with weakness to his right side, drooling from the right side of his face, and slurred speech.  At the time of my evaluation, slurred speech has improved.  He tells me the weakness was noticed right lower extremity only and not his upper extremity.  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  ST consulted for speech/language cognitive assessment.   Assessment / Plan / Recommendation Clinical Impression  Recommend f/u with [facility] SLP prn for dysarthria tx if symptoms persist.  No further ST needs identified within acute setting.  Pt assessed via [employer] Mental Status Examination (SLUMS), informal observations, patient report, chart review and portions of Cognistat.  Pt Ox4 and aware of current functional deficits stating he needed min A with R side d/t weakness.  OME revealed R lingual/facial weakness/decreased ROM, but pt denies any dysarthria/dysphagia at current time, although speech noted to be min hypophonic and exhibiting imprecise  articulation during conversation with 75% intelligibility noted with longer utterances.  Pt able to complete simple calculation tasks, reverse digits up to 4 without A, answer paragraph retention questions accurately and did not display any deficits nor require A with naming tasks.  Graphic expression was not formally assessed, but reading accurate for environmental signs/whiteboard and clock during examination.  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  Pt was able to attend to functional tasks and displayed adequate STM for item recall and recent information, but extensive cognitive assessment not completed.  Thank you for this consult.    SLP Assessment  SLP Recommendation/Assessment: All further Speech Language Pathology needs can be addressed in the next venue of care SLP Visit Diagnosis: Dysarthria and anarthria (R47.1)     Assistance Recommended at Discharge  Other (comment) (TBD)  Functional Status Assessment Patient has had a recent decline in their functional status and demonstrates the ability to make significant improvements in function in a reasonable and predictable amount of time.  Frequency and Duration  (evaluation only)         SLP Evaluation Cognition  Overall Cognitive Status: No family/caregiver present to determine baseline cognitive functioning Arousal/Alertness: Awake/alert Orientation Level: Oriented X4 Attention: Sustained Sustained Attention: Appears intact Memory: Appears intact Awareness: Appears intact Problem Solving: Appears intact Safety/Judgment: Appears intact       Comprehension  Auditory Comprehension Overall Auditory Comprehension: Appears within functional limits for tasks assessed Commands: Within Functional Limits Conversation: Simple Visual Recognition/Discrimination Discrimination: Within Function Limits Reading Comprehension Reading Status: Within funtional limits (for simple  tasks;signs/within environment (ie: clock whiteboard))    Expression Expression Primary Mode of Expression: Verbal Verbal Expression Overall Verbal Expression: Appears within functional limits for tasks assessed Level of Generative/Spontaneous Verbalization: Conversation Naming: No impairment Pragmatics: No impairment Non-Verbal Means of Communication: Not applicable Written Expression Dominant Hand: Left Written Expression: Not tested   Oral / Motor  Oral Motor/Sensory Function Overall Oral Motor/Sensory Function: Mild impairment Facial ROM: Reduced right Facial Symmetry: Abnormal symmetry right Lingual ROM: Reduced right Lingual Symmetry: Abnormal symmetry right Motor Speech Overall Motor Speech: Appears within functional limits for tasks assessed Respiration: Within functional limits Phonation: Low vocal intensity Resonance: Within functional limits Articulation: Impaired Level of Impairment: Conversation Intelligibility: Intelligibility reduced Word: 75-100% accurate Phrase: 75-100% accurate Sentence: 75-100% accurate Conversation: 75-100% accurate Motor Planning: Within functional limits Motor Speech Errors: Not applicable            Pat Yacine Droz,M.S.,CCC-SLP 06/05/2024, 10:51 AM
